# Patient Record
Sex: Female | Born: 1955 | Race: Black or African American | Hispanic: No | Marital: Single | Smoking: Never smoker
Health system: Southern US, Community
[De-identification: ages and names within clinical notes are randomized; demographics above are authoritative.]

## PROBLEM LIST (undated history)

## (undated) DIAGNOSIS — I10 Essential (primary) hypertension: Secondary | ICD-10-CM

## (undated) DIAGNOSIS — E039 Hypothyroidism, unspecified: Secondary | ICD-10-CM

## (undated) DIAGNOSIS — D649 Anemia, unspecified: Secondary | ICD-10-CM

## (undated) DIAGNOSIS — M1990 Unspecified osteoarthritis, unspecified site: Secondary | ICD-10-CM

## (undated) DIAGNOSIS — E7800 Pure hypercholesterolemia, unspecified: Secondary | ICD-10-CM

## (undated) DIAGNOSIS — C50919 Malignant neoplasm of unspecified site of unspecified female breast: Secondary | ICD-10-CM

## (undated) HISTORY — PX: BREAST EXCISIONAL BIOPSY: SUR124

## (undated) HISTORY — PX: BREAST LUMPECTOMY: SHX2

## (undated) HISTORY — DX: Malignant neoplasm of unspecified site of unspecified female breast: C50.919

## (undated) HISTORY — PX: ABDOMINAL HYSTERECTOMY: SHX81

---

## 2008-03-03 ENCOUNTER — Ambulatory Visit: Payer: Self-pay | Admitting: Family Medicine

## 2008-03-22 ENCOUNTER — Ambulatory Visit: Payer: Self-pay | Admitting: *Deleted

## 2008-03-22 ENCOUNTER — Emergency Department (HOSPITAL_COMMUNITY): Admission: EM | Admit: 2008-03-22 | Discharge: 2008-03-22 | Payer: Self-pay | Admitting: Family Medicine

## 2008-03-31 ENCOUNTER — Ambulatory Visit (HOSPITAL_COMMUNITY): Admission: RE | Admit: 2008-03-31 | Discharge: 2008-03-31 | Payer: Self-pay | Admitting: Family Medicine

## 2008-04-18 ENCOUNTER — Encounter (INDEPENDENT_AMBULATORY_CARE_PROVIDER_SITE_OTHER): Payer: Self-pay | Admitting: Family Medicine

## 2008-04-18 ENCOUNTER — Ambulatory Visit: Payer: Self-pay | Admitting: Internal Medicine

## 2008-04-18 LAB — CONVERTED CEMR LAB
Alkaline Phosphatase: 77 units/L (ref 39–117)
Basophils Absolute: 0 10*3/uL (ref 0.0–0.1)
Basophils Relative: 0 % (ref 0–1)
Calcium: 9.2 mg/dL (ref 8.4–10.5)
Chloride: 105 meq/L (ref 96–112)
Cholesterol: 248 mg/dL — ABNORMAL HIGH (ref 0–200)
Eosinophils Absolute: 0.1 10*3/uL (ref 0.0–0.7)
Eosinophils Relative: 1 % (ref 0–5)
Free T4: 0.95 ng/dL (ref 0.89–1.80)
Glucose, Bld: 89 mg/dL (ref 70–99)
HCT: 43.6 % (ref 36.0–46.0)
HDL: 51 mg/dL (ref >39)
Lymphs Abs: 2 10*3/uL (ref 0.7–4.0)
MCHC: 31.2 g/dL (ref 30.0–36.0)
MCV: 98 fL (ref 78.0–100.0)
Neutrophils Relative %: 57 % (ref 43–77)
Platelets: 272 10*3/uL (ref 150–400)
RPR Ser Ql: REACTIVE — AB
Sodium: 140 meq/L (ref 135–145)
Total Bilirubin: 0.8 mg/dL (ref 0.3–1.2)
Total Protein: 8 g/dL (ref 6.0–8.3)
VLDL: 21 mg/dL (ref 0–40)
WBC: 6.2 10*3/uL (ref 4.0–10.5)

## 2008-05-12 ENCOUNTER — Ambulatory Visit: Payer: Self-pay | Admitting: Family Medicine

## 2008-05-19 ENCOUNTER — Ambulatory Visit: Payer: Self-pay | Admitting: Internal Medicine

## 2008-05-19 ENCOUNTER — Encounter (INDEPENDENT_AMBULATORY_CARE_PROVIDER_SITE_OTHER): Payer: Self-pay | Admitting: Family Medicine

## 2008-11-01 ENCOUNTER — Ambulatory Visit: Payer: Self-pay | Admitting: Family Medicine

## 2008-11-08 ENCOUNTER — Ambulatory Visit: Payer: Self-pay | Admitting: Family Medicine

## 2009-02-14 ENCOUNTER — Ambulatory Visit: Payer: Self-pay | Admitting: Family Medicine

## 2009-02-14 LAB — CONVERTED CEMR LAB
Cholesterol: 240 mg/dL — ABNORMAL HIGH (ref 0–200)
LDL Cholesterol: 171 mg/dL — ABNORMAL HIGH (ref 0–99)
TSH: 1.503 microintl units/mL (ref 0.350–4.500)
Total CHOL/HDL Ratio: 4.7
Triglycerides: 92 mg/dL (ref <150)
VLDL: 18 mg/dL (ref 0–40)
Vit D, 25-Hydroxy: 31 ng/mL (ref 30–89)

## 2009-05-31 ENCOUNTER — Ambulatory Visit: Payer: Self-pay | Admitting: Internal Medicine

## 2009-05-31 ENCOUNTER — Encounter (INDEPENDENT_AMBULATORY_CARE_PROVIDER_SITE_OTHER): Payer: Self-pay | Admitting: Family Medicine

## 2009-05-31 LAB — CONVERTED CEMR LAB: Cholesterol: 198 mg/dL (ref 0–200)

## 2009-06-19 ENCOUNTER — Ambulatory Visit: Payer: Self-pay | Admitting: Family Medicine

## 2009-06-19 LAB — CONVERTED CEMR LAB
AST: 19 units/L (ref 0–37)
Albumin: 4.4 g/dL (ref 3.5–5.2)
Alkaline Phosphatase: 73 units/L (ref 39–117)
Total Bilirubin: 0.6 mg/dL (ref 0.3–1.2)
Total Protein: 7.5 g/dL (ref 6.0–8.3)

## 2009-06-23 ENCOUNTER — Ambulatory Visit: Payer: Self-pay | Admitting: Family Medicine

## 2009-07-04 ENCOUNTER — Encounter: Admission: RE | Admit: 2009-07-04 | Discharge: 2009-07-04 | Payer: Self-pay | Admitting: Infectious Diseases

## 2009-07-17 ENCOUNTER — Encounter: Admission: RE | Admit: 2009-07-17 | Discharge: 2009-07-17 | Payer: Self-pay | Admitting: Infectious Diseases

## 2009-11-20 ENCOUNTER — Ambulatory Visit: Payer: Self-pay | Admitting: Family Medicine

## 2009-11-20 LAB — CONVERTED CEMR LAB
Basophils Absolute: 0 10*3/uL (ref 0.0–0.1)
Basophils Relative: 0 % (ref 0–1)
HCT: 41.3 % (ref 36.0–46.0)
Lymphocytes Relative: 26 % (ref 12–46)
Lymphs Abs: 2 10*3/uL (ref 0.7–4.0)
Monocytes Absolute: 0.6 10*3/uL (ref 0.1–1.0)
Monocytes Relative: 8 % (ref 3–12)
Neutro Abs: 5 10*3/uL (ref 1.7–7.7)
Neutrophils Relative %: 65 % (ref 43–77)
Platelets: 270 10*3/uL (ref 150–400)
Vitamin B-12: 453 pg/mL (ref 211–911)
WBC: 7.6 10*3/uL (ref 4.0–10.5)

## 2009-11-24 ENCOUNTER — Ambulatory Visit: Payer: Self-pay | Admitting: Family Medicine

## 2009-12-24 IMAGING — CR DG CHEST 2V
2 series · 2 of 2 positions shown · non-contrast
Comparison: None

CLINICAL DATA: Chest pain and dizziness.  Left arm pain.

CHEST - 2 VIEW

[w chest pa]
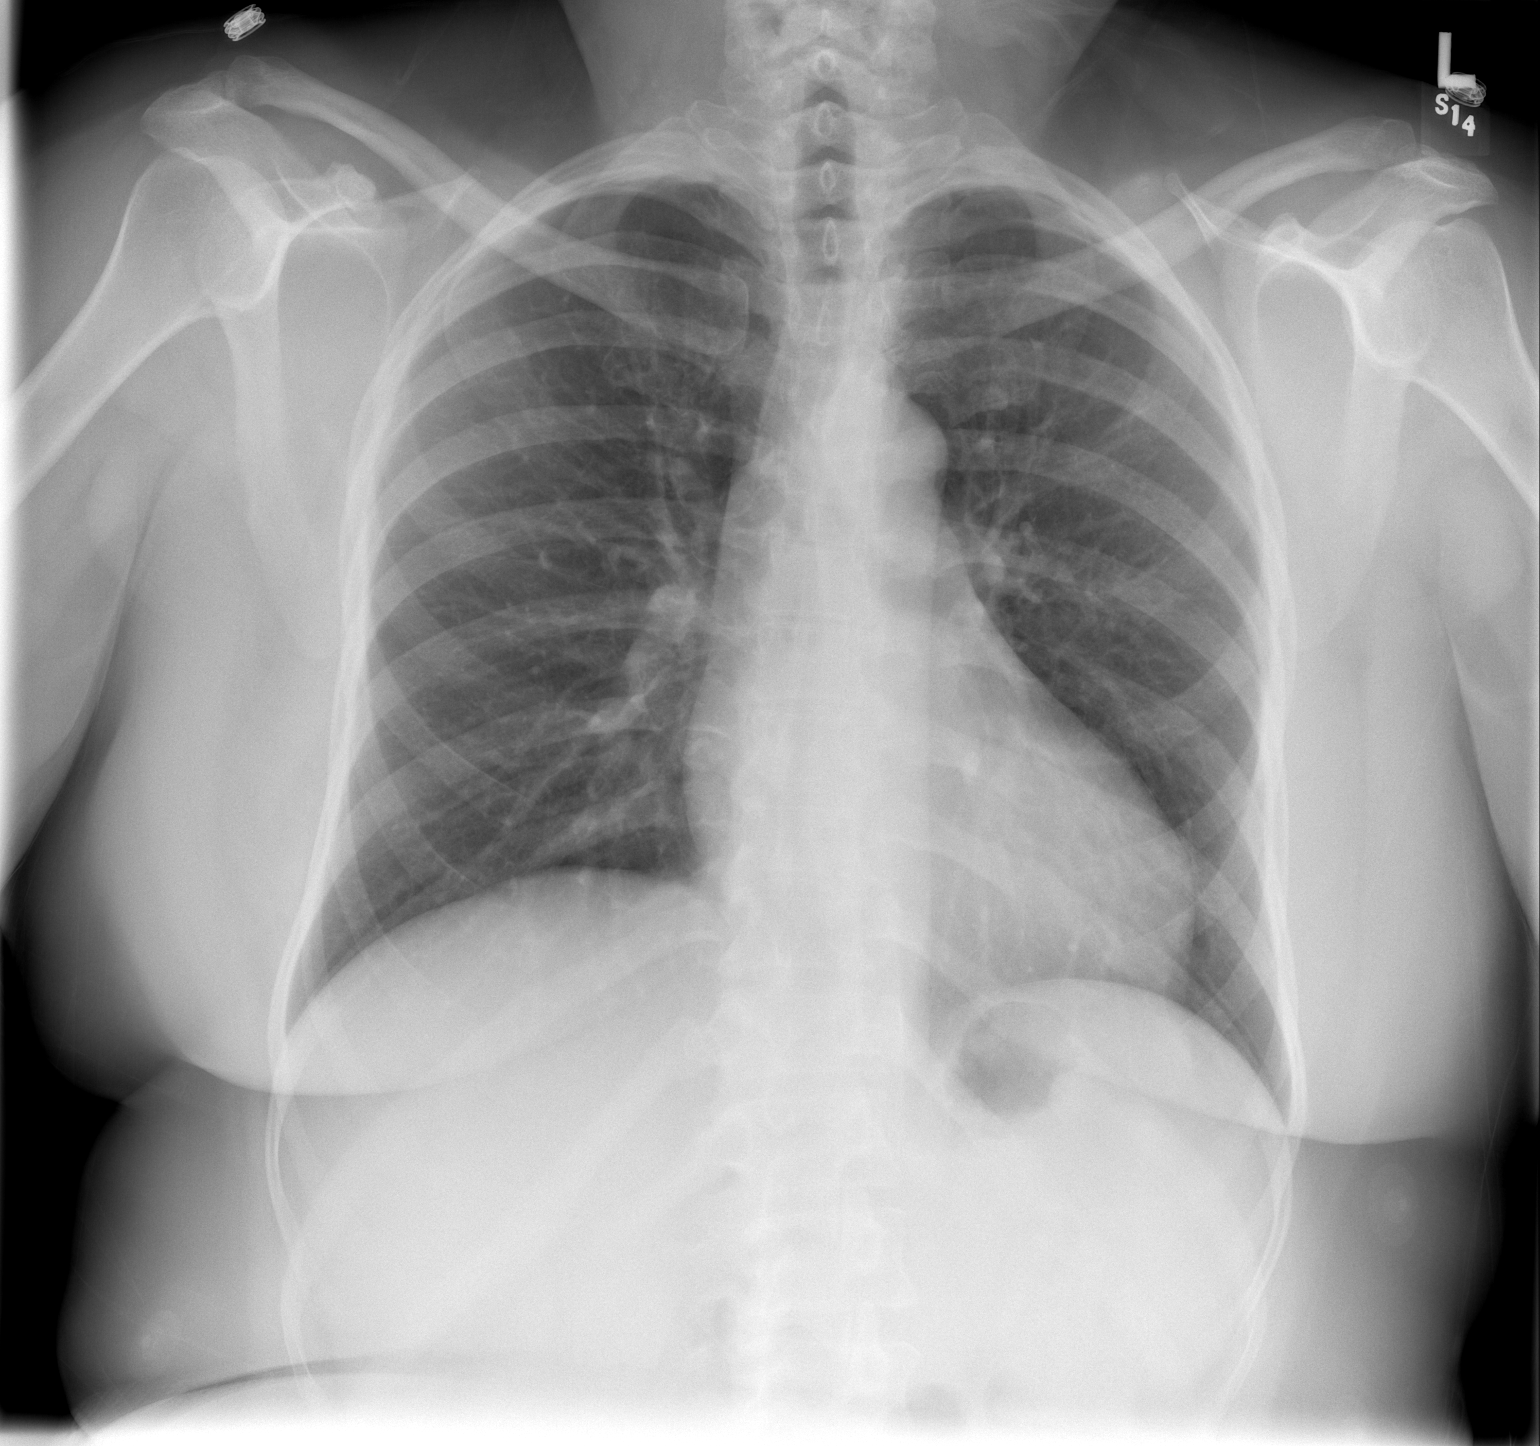

[w chest lat]
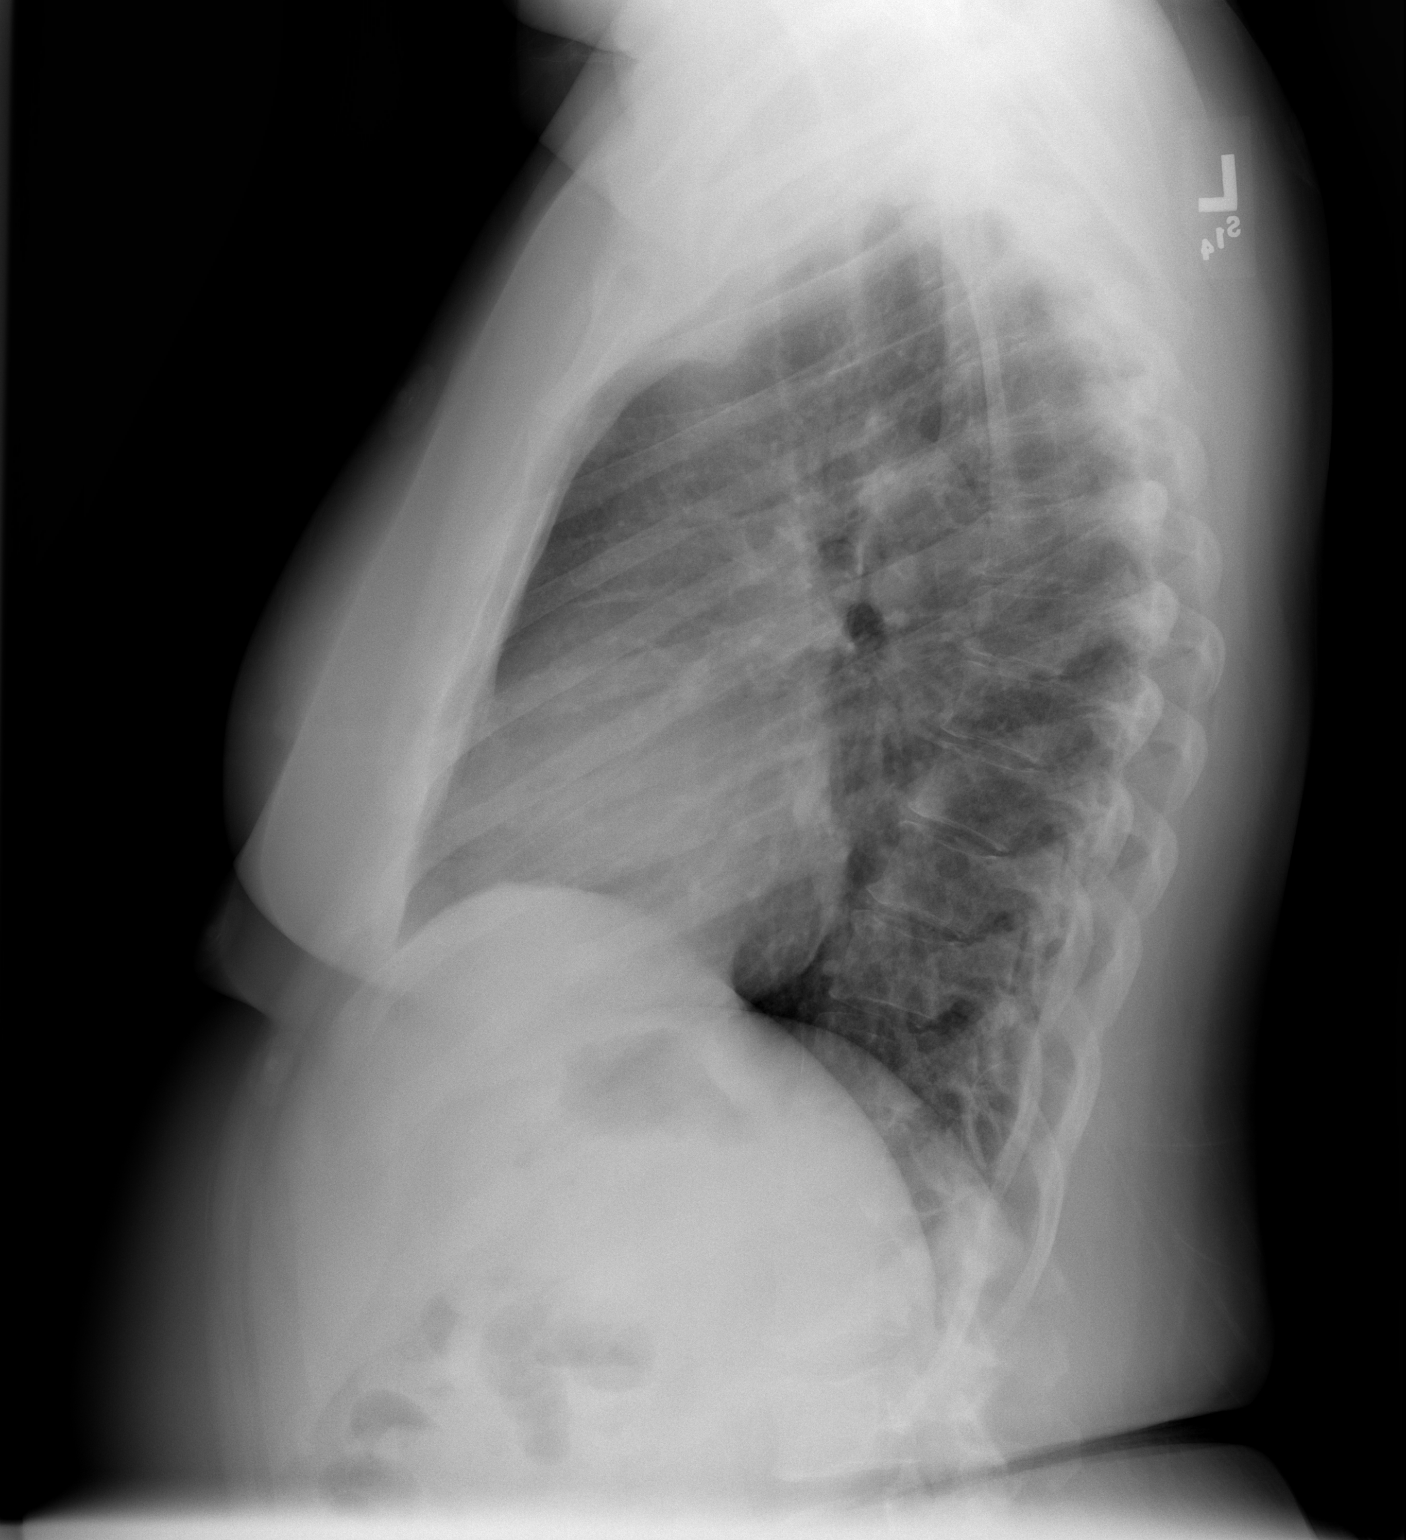

[2 of 2 positions shown; findings below may reference images not displayed]

FINDINGS: Heart size is upper normal and there is left ventricular
enlargement.  There is no heart failure or edema.  The lungs are
clear.
IMPRESSION: No active cardiopulmonary disease.

## 2010-02-23 ENCOUNTER — Ambulatory Visit: Payer: Self-pay | Admitting: Internal Medicine

## 2011-02-12 LAB — POCT CARDIAC MARKERS
CKMB, poc: 3.3
Troponin i, poc: <0.05

## 2011-02-12 LAB — CBC
Platelets: 266
RDW: 14.9
WBC: 5.8

## 2011-02-12 LAB — DIFFERENTIAL
Basophils Absolute: 0
Basophils Relative: 1
Eosinophils Relative: 1
Lymphs Abs: 1.6
Monocytes Relative: 8
Neutro Abs: 3.6
Neutrophils Relative %: 62

## 2011-02-12 LAB — POCT I-STAT, CHEM 8
BUN: 6
Calcium, Ion: 1.18
Chloride: 105
Creatinine, Ser: 0.9
TCO2: 28

## 2015-11-26 ENCOUNTER — Encounter (HOSPITAL_COMMUNITY): Payer: Self-pay

## 2015-11-26 ENCOUNTER — Emergency Department (HOSPITAL_COMMUNITY)
Admission: EM | Admit: 2015-11-26 | Discharge: 2015-11-26 | Disposition: A | Discharge status: Home / Self Care | Payer: PRIVATE HEALTH INSURANCE | Attending: Physician Assistant | Admitting: Physician Assistant

## 2015-11-26 DIAGNOSIS — Z79899 Other long term (current) drug therapy: Secondary | ICD-10-CM | POA: Insufficient documentation

## 2015-11-26 DIAGNOSIS — E039 Hypothyroidism, unspecified: Secondary | ICD-10-CM | POA: Diagnosis not present

## 2015-11-26 DIAGNOSIS — R251 Tremor, unspecified: Secondary | ICD-10-CM | POA: Insufficient documentation

## 2015-11-26 HISTORY — DX: Hypothyroidism, unspecified: E03.9

## 2015-11-26 LAB — CBC WITH DIFFERENTIAL/PLATELET
BASOS ABS: 0 10*3/uL (ref 0.0–0.1)
Basophils Relative: 1 %
Eosinophils Absolute: 0.1 10*3/uL (ref 0.0–0.7)
Eosinophils Relative: 2 %
HEMATOCRIT: 39.7 % (ref 36.0–46.0)
Hemoglobin: 13.2 g/dL (ref 12.0–15.0)
LYMPHS ABS: 2 10*3/uL (ref 0.7–4.0)
LYMPHS PCT: 30 %
MCH: 29.9 pg (ref 26.0–34.0)
MCHC: 33.2 g/dL (ref 30.0–36.0)
MCV: 90 fL (ref 78.0–100.0)
MONO ABS: 0.6 10*3/uL (ref 0.1–1.0)
Monocytes Relative: 10 %
NEUTROS ABS: 3.8 10*3/uL (ref 1.7–7.7)
Neutrophils Relative %: 57 %
Platelets: 235 10*3/uL (ref 150–400)
RBC: 4.41 MIL/uL (ref 3.87–5.11)
RDW: 14.1 % (ref 11.5–15.5)
WBC: 6.5 10*3/uL (ref 4.0–10.5)

## 2015-11-26 LAB — COMPREHENSIVE METABOLIC PANEL
ALT: 15 U/L (ref 14–54)
AST: 19 U/L (ref 15–41)
Albumin: 4.7 g/dL (ref 3.5–5.0)
Alkaline Phosphatase: 73 U/L (ref 38–126)
Anion gap: 4 — ABNORMAL LOW (ref 5–15)
BUN: 11 mg/dL (ref 6–20)
CHLORIDE: 107 mmol/L (ref 101–111)
CO2: 30 mmol/L (ref 22–32)
CREATININE: 0.72 mg/dL (ref 0.44–1.00)
Calcium: 9.4 mg/dL (ref 8.9–10.3)
GFR calc Af Amer: >60 mL/min (ref >60)
GFR calc non Af Amer: >60 mL/min (ref >60)
Glucose, Bld: 99 mg/dL (ref 65–99)
Potassium: 4 mmol/L (ref 3.5–5.1)
SODIUM: 141 mmol/L (ref 135–145)
Total Bilirubin: 0.8 mg/dL (ref 0.3–1.2)
Total Protein: 8 g/dL (ref 6.5–8.1)

## 2015-11-26 LAB — I-STAT TROPONIN, ED: Troponin i, poc: 0 ng/mL (ref 0.00–0.08)

## 2015-11-26 LAB — TSH: TSH: 3.09 u[IU]/mL (ref 0.350–4.500)

## 2015-11-26 NOTE — ED Notes (Signed)
Pt denies any symptoms at present. Last episode of sweating/lightheadedness/shaking was at 1500 and lasted 10 minutes.

## 2015-11-26 NOTE — ED Notes (Signed)
Pt informed main lab has been called to draw.  Pt assisted to BR.

## 2015-11-26 NOTE — ED Notes (Signed)
Pt c/o intermittent dizziness, excessive sweating, and bilateral hand tremors x 2 days.  Denies pain.  Denies n/v/d.   Pt reports 2 episodes x 2 days ago and 4 episodes today.  Denies currently symptoms.  Pt reports that she has been out of thyroid medication since March 2016.

## 2015-11-26 NOTE — ED Provider Notes (Signed)
CSN: [id number]     Arrival date & time 11/26/15  1703 History   First MD Initiated Contact with Patient 11/26/15 1735     Chief Complaint  Patient presents with  . Dizziness  . Excessive Sweating  . Tremors     (Consider location/radiation/quality/duration/timing/severity/associated sxs/prior Treatment) HPI   Pt is a 60 year old femlae PMH of Hashimotos, been off synthroid for a year.  Here with dizziness diaphoresis, palpitations and tremors. 4 episodes Friday, 3 episodes today. Normal appetitie and otherwise feels baseline.   Patient lost touch with PCP and hasn't been on her medications.  These events happen on exertion occasionally. Also have happened at rest.  No chest pan or SOB associated. No chest pain equivalents.   No fevers night sweats.   Past Medical History  Diagnosis Date  . Hypothyroidism    Past Surgical History  Procedure Laterality Date  . Abdominal hysterectomy    . Breast lumpectomy Left    History reviewed. No pertinent family history. Social History  Substance Use Topics  . Smoking status: Never Smoker   . Smokeless tobacco: None  . Alcohol Use: No   OB History    No data available     Review of Systems  Constitutional: Negative for fever, activity change and fatigue.  HENT: Negative for congestion.   Respiratory: Negative for shortness of breath.   Cardiovascular: Positive for palpitations. Negative for chest pain.  Gastrointestinal: Negative for abdominal pain.  Endocrine: Negative for cold intolerance, heat intolerance, polydipsia, polyphagia and polyuria.  Genitourinary: Negative for difficulty urinating.  Psychiatric/Behavioral: Positive for agitation. Negative for confusion.  All other systems reviewed and are negative.     Allergies  Penicillins  Home Medications   Prior to Admission medications   Medication Sig Start Date End Date Taking? Authorizing Provider  acetaminophen (TYLENOL) 500 MG tablet Take 1,500 mg by mouth  every 6 (six) hours as needed for mild pain or headache.   Yes Historical Provider, MD   BP 145/99 mmHg  Pulse 76  Temp(Src) 97.9 F (36.6 C) (Oral)  Resp 14  Ht  (1.626 m)  Wt 207 lb (93.895 kg)  BMI 35.51 kg/m2  SpO2 100% Physical Exam  Constitutional: She is oriented to person, place, and time. She appears well-developed and well-nourished.  HENT:  Head: Normocephalic and atraumatic.  Eyes: Conjunctivae are normal. Right eye exhibits no discharge.  Neck: Neck supple.  Cardiovascular: Normal rate, regular rhythm and normal heart sounds.   No murmur heard. Pulmonary/Chest: Effort normal and breath sounds normal. She has no wheezes. She has no rales.  Abdominal: Soft. She exhibits no distension. There is no tenderness.  Musculoskeletal: Normal range of motion. She exhibits no edema.  Neurological: She is oriented to person, place, and time. No cranial nerve deficit.  Skin: Skin is warm and dry. No rash noted. She is not diaphoretic.  Psychiatric: She has a normal mood and affect. Her behavior is normal.  Nursing note and vitals reviewed.   ED Course  Procedures (including critical care time) Labs Review Labs Reviewed  COMPREHENSIVE METABOLIC PANEL - Abnormal; Notable for the following:    Anion gap 4 (*)    All other components within normal limits  CBC WITH DIFFERENTIAL/PLATELET  TSH  T4, FREE  I-STAT TROPOININ, ED    Imaging Review No results found. I have personally reviewed and evaluated these images and lab results as part of my medical decision-making.   EKG Interpretation   Date/Time:  Sunday November 26 2015 18:30:04 EDT Ventricular Rate:  60 PR Interval:    QRS Duration: 99 QT Interval:  417 QTC Calculation: 417 R Axis:   81 Text Interpretation:  Sinus rhythm Baseline wander in lead(s) V6 no acute  ischemia noted Confirmed by Kandis MannanMACKUEN, COURTNEY ([id number]) on 11/26/2015 9:11:10  PM      MDM   Final diagnoses:  Episode of shaking    Patient is a  60 year old female presenting with multiple episodes of shakiness. Patient reports that she's had this several times where she gets up and walks around and feels a little bit shaky. Patient has untreated thyroid disease. She had Hashimoto's and was on Synthroid but has not filled her prescription in the last year.  Patient appears very well on exam. Normal physical exam, vital signs. Her labs as well as her TSH are normal. We'll discharge and have her follow up with primary care physician.   Do not think there is any acute pathology such as cardiac or pulmonary at this time given her normal vital signs, physical exam, labs.    Chi Garlow Randall AnLyn Aleina Burgio, MD 11/26/15 2118

## 2015-11-26 NOTE — Discharge Instructions (Signed)
You have a history of Hashimoto's. We recommend that you follow-up with a primary care physician to get the appropriate treatment. We do not believe there is any emergency treatment needed at this time. Please call the number on this discharge information to find a primary care physician.

## 2015-11-26 NOTE — ED Notes (Signed)
Lab called, advised that needs Lt. Green tube....informed RN.

## 2015-11-27 LAB — T4, FREE: FREE T4: 0.83 ng/dL (ref 0.61–1.12)

## 2016-05-16 ENCOUNTER — Encounter (HOSPITAL_COMMUNITY): Payer: Self-pay

## 2016-05-16 ENCOUNTER — Emergency Department (HOSPITAL_COMMUNITY)
Admission: EM | Admit: 2016-05-16 | Discharge: 2016-05-17 | Disposition: A | Discharge status: Home / Self Care | Payer: Self-pay | Attending: Emergency Medicine | Admitting: Emergency Medicine

## 2016-05-16 ENCOUNTER — Emergency Department (HOSPITAL_COMMUNITY): Payer: Self-pay

## 2016-05-16 DIAGNOSIS — K529 Noninfective gastroenteritis and colitis, unspecified: Secondary | ICD-10-CM | POA: Insufficient documentation

## 2016-05-16 DIAGNOSIS — E039 Hypothyroidism, unspecified: Secondary | ICD-10-CM | POA: Insufficient documentation

## 2016-05-16 DIAGNOSIS — R1030 Lower abdominal pain, unspecified: Secondary | ICD-10-CM

## 2016-05-16 LAB — COMPREHENSIVE METABOLIC PANEL
ALK PHOS: 66 U/L (ref 38–126)
ALT: 26 U/L (ref 14–54)
AST: 28 U/L (ref 15–41)
Albumin: 4.3 g/dL (ref 3.5–5.0)
Anion gap: 8 (ref 5–15)
BILIRUBIN TOTAL: 0.8 mg/dL (ref 0.3–1.2)
BUN: 11 mg/dL (ref 6–20)
CALCIUM: 8.7 mg/dL — AB (ref 8.9–10.3)
CO2: 25 mmol/L (ref 22–32)
Chloride: 102 mmol/L (ref 101–111)
Creatinine, Ser: 0.84 mg/dL (ref 0.44–1.00)
GFR calc Af Amer: >60 mL/min (ref >60)
GFR calc non Af Amer: >60 mL/min (ref >60)
GLUCOSE: 111 mg/dL — AB (ref 65–99)
Potassium: 3.4 mmol/L — ABNORMAL LOW (ref 3.5–5.1)
Sodium: 135 mmol/L (ref 135–145)
TOTAL PROTEIN: 7.7 g/dL (ref 6.5–8.1)

## 2016-05-16 LAB — CBC
HEMATOCRIT: 42.3 % (ref 36.0–46.0)
Hemoglobin: 14 g/dL (ref 12.0–15.0)
MCH: 29.7 pg (ref 26.0–34.0)
MCHC: 33.1 g/dL (ref 30.0–36.0)
MCV: 89.8 fL (ref 78.0–100.0)
Platelets: 208 10*3/uL (ref 150–400)
RBC: 4.71 MIL/uL (ref 3.87–5.11)
RDW: 14.4 % (ref 11.5–15.5)
WBC: 4.4 10*3/uL (ref 4.0–10.5)

## 2016-05-16 LAB — URINALYSIS, ROUTINE W REFLEX MICROSCOPIC
BACTERIA UA: NONE SEEN
Bilirubin Urine: NEGATIVE
GLUCOSE, UA: NEGATIVE mg/dL
KETONES UR: NEGATIVE mg/dL
Leukocytes, UA: NEGATIVE
NITRITE: NEGATIVE
PROTEIN: 30 mg/dL — AB
Specific Gravity, Urine: 1.029 (ref 1.005–1.030)
pH: 5 (ref 5.0–8.0)

## 2016-05-16 LAB — LIPASE, BLOOD: Lipase: 28 U/L (ref 11–51)

## 2016-05-16 MED ORDER — SODIUM CHLORIDE 0.9 % IV BOLUS (SEPSIS)
1000.0000 mL | Freq: Once | INTRAVENOUS | Status: AC
  Administered 2016-05-16: 1000 mL via INTRAVENOUS

## 2016-05-16 MED ORDER — ONDANSETRON HCL 4 MG/2ML IJ SOLN
4.0000 mg | Freq: Once | INTRAMUSCULAR | Status: AC
  Administered 2016-05-16: 4 mg via INTRAVENOUS
  Filled 2016-05-16: qty 2

## 2016-05-16 MED ORDER — POTASSIUM CHLORIDE CRYS ER 20 MEQ PO TBCR
40.0000 meq | EXTENDED_RELEASE_TABLET | Freq: Once | ORAL | Status: AC
  Administered 2016-05-16: 40 meq via ORAL
  Filled 2016-05-16: qty 2

## 2016-05-16 MED ORDER — IOPAMIDOL (ISOVUE-300) INJECTION 61%
INTRAVENOUS | Status: AC
  Filled 2016-05-16: qty 100

## 2016-05-16 NOTE — ED Triage Notes (Signed)
PT C/O UMBILICAL ABDOMINAL PAIN WITH DIARRHEA X2 DAYS. PT STS SHE VOMITED X1 TODAY. PT ALSO C/O JOINT PAIN AND SKIN SENSITIVITY. DENIES FEVER.

## 2016-05-16 NOTE — ED Provider Notes (Signed)
[facility]-EMERGENCY DEPT Provider Note   CSN: [id number] Arrival date & time: 05/16/16  1857   By signing my name below, I, Nelwyn SalisburyJoshua Fowler, attest that this documentation has been prepared under the direction and in the presence of Pricilla LovelessScott Gurkirat Basher, MD . Electronically Signed: Nelwyn SalisburyJoshua Fowler, Scribe. 05/16/2016. 11:31 PM.  History   Chief Complaint Chief Complaint  Patient presents with  . Abdominal Pain  . Diarrhea    The history is provided by the patient. No language interpreter was used.    HPI Comments:  Taylor House is a 61 y.o. female with pmhx of hypothyroidism who presents to the Emergency Department complaining of sudden-onset gradually resolving constant periumbilical abdominal pain onset yesterday. She describes her pain as a 6/10 sharp pain that does not radiate. Pt reports associated nausea, vomiting and diarrhea. She notes that her vomiting and diarrhea have currently resolved, and her last episode of diarrhea was about 7 hours ago. No modifying factors indicated. She denies any back pain, dysuria, hematuria or blood in her stool. 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    Past Medical History:  Diagnosis Date  . Hypothyroidism     There are no active problems to display for this patient.   Past Surgical History:  Procedure Laterality Date  . ABDOMINAL HYSTERECTOMY    . BREAST LUMPECTOMY Left     OB History    No data available       Home Medications    Prior to Admission medications   Medication Sig Start Date End Date Taking? Authorizing Provider  acetaminophen (TYLENOL) 500 MG tablet Take 1,500 mg by mouth every 6 (six) hours as needed for mild pain or headache.   Yes Historical Provider, MD  ondansetron (ZOFRAN ODT) 4 MG disintegrating tablet Take 1 tablet (4 mg total) by mouth every 8 (eight) hours as needed for nausea or vomiting. 05/17/16   Pricilla LovelessScott Pedro Oldenburg, MD    Family History History reviewed. No  pertinent family history.  Social History Social History  Substance Use Topics  . Smoking status: Never Smoker  . Smokeless tobacco: Never Used  . Alcohol use No     Allergies   Penicillins   Review of Systems Review of Systems  Gastrointestinal: Positive for abdominal pain, diarrhea, nausea and vomiting. Negative for blood in stool.  Genitourinary: Negative for dysuria and hematuria.  Musculoskeletal: Negative for back pain.  All other systems reviewed and are negative.    Physical Exam Updated Vital Signs BP 132/76 (BP Location: Left Arm)   Pulse 93   Temp 99.8 F (37.7 C) (Oral)   Resp 18   Ht 5' 4.5" (1.638 m)   Wt 201 lb (91.2 kg)   SpO2 99%   BMI 33.97 kg/m   Physical Exam  Constitutional: She is oriented to person, place, and time. She appears well-developed and well-nourished.  HENT:  Head: Normocephalic and atraumatic.  Right Ear: External ear normal.  Left Ear: External ear normal.  Nose: Nose normal.  Eyes: Right eye exhibits no discharge. Left eye exhibits no discharge.  Cardiovascular: Normal rate, regular rhythm and normal heart sounds.   Pulmonary/Chest: Effort normal and breath sounds normal.  Abdominal: Soft. There is tenderness (Moderate suprapubic and LLQ tenderness).  Neurological: She is alert and oriented to person, place, and time.  Skin: Skin is warm and dry.  Nursing note and vitals reviewed.    ED Treatments / Results  DIAGNOSTIC  STUDIES:  Oxygen Saturation is 100% on RA, normal by my interpretation.    COORDINATION OF CARE:  11:36 PM Discussed treatment plan with pt at bedside which includes Zofran and imaging and pt agreed to plan. Pt denies any pain medication in the ED   Labs (all labs ordered are listed, but only abnormal results are displayed) Labs Reviewed  COMPREHENSIVE METABOLIC PANEL - Abnormal; Notable for the following:       Result Value   Potassium 3.4 (*)    Glucose, Bld 111 (*)    Calcium 8.7 (*)    All  other components within normal limits  URINALYSIS, ROUTINE W REFLEX MICROSCOPIC - Abnormal; Notable for the following:    Color, Urine AMBER (*)    APPearance HAZY (*)    Hgb urine dipstick MODERATE (*)    Protein, ur 30 (*)    Squamous Epithelial / LPF 0-5 (*)    All other components within normal limits  LIPASE, BLOOD  CBC    EKG  EKG Interpretation None       Radiology Ct Abdomen Pelvis W Contrast  Result Date: 05/17/2016 CLINICAL DATA:  Periumbilical pain and diarrhea for 2 days. EXAM: CT ABDOMEN AND PELVIS WITH CONTRAST TECHNIQUE: Multidetector CT imaging of the abdomen and pelvis was performed using the standard protocol following bolus administration of intravenous contrast. CONTRAST:  ISOVUE-300 IOPAMIDOL (ISOVUE-300) INJECTION 61% COMPARISON:  None. FINDINGS: Lower chest: No acute abnormality. Hepatobiliary: Normal gallbladder and bile ducts. 7 mm hypodensity in the right hepatic lobe medially, more likely benign although not conclusively characterized. No suspicious focal liver lesions. Pancreas: Unremarkable. No pancreatic ductal dilatation or surrounding inflammatory changes. Spleen: Normal in size without focal abnormality. Adrenals/Urinary Tract: Multiple low-attenuation renal lesions, likely simple cysts, measuring up to 1.9 cm in the upper left renal pole. No suspicious renal parenchymal lesions. No urinary calculi. No hydronephrosis or ureteral dilatation. Unremarkable urinary bladder. Both adrenals are normal. Stomach/Bowel: Stomach is within normal limits. Appendix appears normal. No evidence of bowel wall thickening, distention, or inflammatory changes. Vascular/Lymphatic: No significant vascular findings are present. No enlarged abdominal or pelvic lymph nodes. Reproductive: Status post hysterectomy. No adnexal masses. Other: No acute inflammation.  No ascites. Musculoskeletal: No significant skeletal lesions. IMPRESSION: No significant abnormality. Electronically  Signed   By: Ellery Plunk M.D.   On: 05/17/2016 00:21    Procedures Procedures (including critical care time)  Medications Ordered in ED Medications  potassium chloride SA (K-DUR,KLOR-CON) CR tablet 40 mEq (40 mEq Oral Given 05/16/16 2355)  sodium chloride 0.9 % bolus 1,000 mL (0 mLs Intravenous Stopped 05/17/16 0100)  ondansetron (ZOFRAN) injection 4 mg (4 mg Intravenous Given 05/16/16 2356)  iopamidol (ISOVUE-300) 61 % injection 100 mL (100 mLs Intravenous Contrast Given 05/17/16 0003)     Initial Impression / Assessment and Plan / ED Course  I have reviewed the triage vital signs and the nursing notes.  Pertinent labs & imaging results that were available during my care of the patient were reviewed by me and considered in my medical decision making (see chart for details).  Clinical Course as of May 17 1720  Morey Hummingbird May 16, 2016  2337 CT due to focal lower abdominal pain, r/o diverticulitis  [SG]  Fri May 17, 2016  0058 CT negative.  [SG]    Clinical Course User Index [SG] Pricilla Loveless, MD    Declines pain meds in ED. No vomiting, tolerating PO. Likely a viral GI illness. Labwork unremarkable besides mild  hypokalemia, repleted in ED. Zofran as needed, increase fluids at home, f/u with PCP. Discussed return precautions.   Final Clinical Impressions(s) / ED Diagnoses   Final diagnoses:  Acute gastroenteritis  Lower abdominal pain    New Prescriptions Discharge Medication List as of 05/17/2016 12:58 AM    START taking these medications   Details  ondansetron (ZOFRAN ODT) 4 MG disintegrating tablet Take 1 tablet (4 mg total) by mouth every 8 (eight) hours as needed for nausea or vomiting., Starting Fri 05/17/2016, Print       I personally performed the services described in this documentation, which was scribed in my presence. The recorded information has been reviewed and is accurate.     Pricilla Loveless, MD 05/17/16 1723

## 2016-05-17 MED ORDER — IOPAMIDOL (ISOVUE-300) INJECTION 61%
100.0000 mL | Freq: Once | INTRAVENOUS | Status: AC | PRN
  Administered 2016-05-17: 100 mL via INTRAVENOUS

## 2016-05-17 MED ORDER — ONDANSETRON 4 MG PO TBDP: 4.0000 mg | ORAL_TABLET | Freq: Three times a day (TID) | ORAL | 0 refills | Status: DC | PRN

## 2018-04-27 DIAGNOSIS — Z8679 Personal history of other diseases of the circulatory system: Secondary | ICD-10-CM | POA: Diagnosis not present

## 2018-04-27 DIAGNOSIS — R8290 Unspecified abnormal findings in urine: Secondary | ICD-10-CM | POA: Diagnosis not present

## 2018-04-27 DIAGNOSIS — Z13220 Encounter for screening for lipoid disorders: Secondary | ICD-10-CM | POA: Diagnosis not present

## 2018-04-27 DIAGNOSIS — Z23 Encounter for immunization: Secondary | ICD-10-CM | POA: Diagnosis not present

## 2018-04-27 DIAGNOSIS — Z0000 Encounter for general adult medical examination without abnormal findings: Secondary | ICD-10-CM | POA: Diagnosis not present

## 2018-04-27 DIAGNOSIS — Z8639 Personal history of other endocrine, nutritional and metabolic disease: Secondary | ICD-10-CM | POA: Diagnosis not present

## 2018-04-29 ENCOUNTER — Other Ambulatory Visit: Payer: Self-pay | Admitting: Physician Assistant

## 2018-04-29 DIAGNOSIS — Z1231 Encounter for screening mammogram for malignant neoplasm of breast: Secondary | ICD-10-CM

## 2018-04-29 DIAGNOSIS — E2839 Other primary ovarian failure: Secondary | ICD-10-CM

## 2018-05-14 DIAGNOSIS — R8290 Unspecified abnormal findings in urine: Secondary | ICD-10-CM | POA: Diagnosis not present

## 2018-05-18 ENCOUNTER — Ambulatory Visit
Admission: RE | Admit: 2018-05-18 | Discharge: 2018-05-18 | Disposition: A | Discharge status: Home / Self Care | Payer: BLUE CROSS/BLUE SHIELD | Source: Ambulatory Visit | Attending: Physician Assistant | Admitting: Physician Assistant

## 2018-05-18 DIAGNOSIS — Z1231 Encounter for screening mammogram for malignant neoplasm of breast: Secondary | ICD-10-CM | POA: Diagnosis not present

## 2018-06-12 DIAGNOSIS — R351 Nocturia: Secondary | ICD-10-CM | POA: Diagnosis not present

## 2018-06-12 DIAGNOSIS — R8271 Bacteriuria: Secondary | ICD-10-CM | POA: Diagnosis not present

## 2018-06-12 DIAGNOSIS — R3121 Asymptomatic microscopic hematuria: Secondary | ICD-10-CM | POA: Diagnosis not present

## 2018-06-29 DIAGNOSIS — N281 Cyst of kidney, acquired: Secondary | ICD-10-CM | POA: Diagnosis not present

## 2018-06-29 DIAGNOSIS — R351 Nocturia: Secondary | ICD-10-CM | POA: Diagnosis not present

## 2018-06-29 DIAGNOSIS — R3121 Asymptomatic microscopic hematuria: Secondary | ICD-10-CM | POA: Diagnosis not present

## 2018-08-17 DIAGNOSIS — M545 Low back pain: Secondary | ICD-10-CM | POA: Diagnosis not present

## 2019-03-15 ENCOUNTER — Emergency Department (HOSPITAL_COMMUNITY)
Admission: EM | Admit: 2019-03-15 | Discharge: 2019-03-15 | Disposition: A | Discharge status: Home / Self Care | Payer: BLUE CROSS/BLUE SHIELD | Attending: Emergency Medicine | Admitting: Emergency Medicine

## 2019-03-15 ENCOUNTER — Emergency Department (HOSPITAL_COMMUNITY): Payer: BLUE CROSS/BLUE SHIELD

## 2019-03-15 ENCOUNTER — Other Ambulatory Visit: Payer: Self-pay

## 2019-03-15 ENCOUNTER — Encounter (HOSPITAL_COMMUNITY): Payer: Self-pay

## 2019-03-15 DIAGNOSIS — I10 Essential (primary) hypertension: Secondary | ICD-10-CM | POA: Insufficient documentation

## 2019-03-15 DIAGNOSIS — Z7982 Long term (current) use of aspirin: Secondary | ICD-10-CM | POA: Insufficient documentation

## 2019-03-15 DIAGNOSIS — E039 Hypothyroidism, unspecified: Secondary | ICD-10-CM | POA: Insufficient documentation

## 2019-03-15 DIAGNOSIS — R072 Precordial pain: Secondary | ICD-10-CM | POA: Insufficient documentation

## 2019-03-15 HISTORY — DX: Essential (primary) hypertension: I10

## 2019-03-15 LAB — CBC
HCT: 41.4 % (ref 36.0–46.0)
Hemoglobin: 13.4 g/dL (ref 12.0–15.0)
MCH: 30.3 pg (ref 26.0–34.0)
MCHC: 32.4 g/dL (ref 30.0–36.0)
MCV: 93.7 fL (ref 80.0–100.0)
Platelets: 262 10*3/uL (ref 150–400)
RBC: 4.42 MIL/uL (ref 3.87–5.11)
RDW: 15 % (ref 11.5–15.5)
WBC: 6.4 10*3/uL (ref 4.0–10.5)
nRBC: 0 % (ref 0.0–0.2)

## 2019-03-15 LAB — BASIC METABOLIC PANEL
Anion gap: 9 (ref 5–15)
BUN: 11 mg/dL (ref 8–23)
CO2: 26 mmol/L (ref 22–32)
Calcium: 9 mg/dL (ref 8.9–10.3)
Chloride: 104 mmol/L (ref 98–111)
Creatinine, Ser: 0.76 mg/dL (ref 0.44–1.00)
GFR calc Af Amer: >60 mL/min (ref >60)
GFR calc non Af Amer: >60 mL/min (ref >60)
Glucose, Bld: 97 mg/dL (ref 70–99)
Potassium: 3.6 mmol/L (ref 3.5–5.1)
Sodium: 139 mmol/L (ref 135–145)

## 2019-03-15 LAB — TROPONIN I (HIGH SENSITIVITY)
Troponin I (High Sensitivity): 3 ng/L (ref <18)
Troponin I (High Sensitivity): 3 ng/L (ref <18)

## 2019-03-15 MED ORDER — SODIUM CHLORIDE 0.9% FLUSH: 3.0000 mL | Freq: Once | INTRAVENOUS | Status: DC

## 2019-03-15 MED ORDER — ALUM & MAG HYDROXIDE-SIMETH 200-200-20 MG/5ML PO SUSP
30.0000 mL | Freq: Once | ORAL | Status: AC
  Administered 2019-03-15: 30 mL via ORAL
  Filled 2019-03-15: qty 30

## 2019-03-15 MED ORDER — KETOROLAC TROMETHAMINE 30 MG/ML IJ SOLN
15.0000 mg | Freq: Once | INTRAMUSCULAR | Status: AC
  Administered 2019-03-15: 15 mg via INTRAVENOUS
  Filled 2019-03-15: qty 1

## 2019-03-15 MED ORDER — LIDOCAINE VISCOUS HCL 2 % MT SOLN
15.0000 mL | Freq: Once | OROMUCOSAL | Status: AC
  Administered 2019-03-15: 15 mL via ORAL
  Filled 2019-03-15: qty 15

## 2019-03-15 MED ORDER — OMEPRAZOLE 20 MG PO CPDR
20.0000 mg | DELAYED_RELEASE_CAPSULE | Freq: Every day | ORAL | 0 refills | Status: DC
Start: 2019-03-15 — End: 2022-11-20

## 2019-03-15 NOTE — ED Provider Notes (Signed)
[facility]-EMERGENCY DEPT Provider Note   CSN: [id number] Arrival date & time: 03/15/19  0257     History   Chief Complaint Chief Complaint  Patient presents with  . Chest Pain    HPI Taylor MccreedySusan House is a 63 y.o. female.  HPI: A 63 year old patient with a history of obesity presents for evaluation of chest pain. Initial onset of pain was more than 6 hours ago. The patient's chest pain is not worse with exertion. The patient's chest pain is middle- or left-sided, is not well-localized, is not described as heaviness/pressure/tightness, is not sharp and does not radiate to the arms/jaw/neck. The patient does not complain of nausea and denies diaphoresis. The patient has no history of stroke, has no history of peripheral artery disease, has not smoked in the past 90 days, denies any history of treated diabetes, has no relevant family history of coronary artery disease (first degree relative at less than age 62), is not hypertensive and has no history of hypercholesterolemia.   The history is provided by the patient.  Chest Pain Pain location:  Substernal area and L chest Pain quality: dull   Pain radiates to:  Does not radiate Pain severity:  Moderate Onset quality:  Gradual Duration:  5 days Timing:  Constant Progression:  Waxing and waning Chronicity:  New Context: at rest   Context: not breathing, not lifting, not movement and not raising an arm   Relieved by:  Nothing Worsened by:  Nothing Ineffective treatments:  Aspirin Associated symptoms: no abdominal pain, no altered mental status, no back pain, no claudication, no cough, no diaphoresis, no dysphagia, no fatigue, no fever, no headache, no lower extremity edema, no near-syncope, no numbness, no orthopnea, no palpitations, no PND, no shortness of breath, no vomiting and no weakness   Patient presents with SS and l sided CP that came on late Thursday.  It is waxing and waning. Her hand feels tingling with  this.  No DOE, no exertional symptoms.  Symptoms worse lying flat.  No changes in vision or speech.  No weakness.  She denied neck or back pain.  No v/d.  No leg pain or swelling.  No travel.    Past Medical History:  Diagnosis Date  . Hypertension   . Hypothyroidism     There are no active problems to display for this patient.   Past Surgical History:  Procedure Laterality Date  . ABDOMINAL HYSTERECTOMY    . BREAST EXCISIONAL BIOPSY Left    benign  . BREAST LUMPECTOMY Left      OB History   No obstetric history on file.      Home Medications    Prior to Admission medications   Medication Sig Start Date End Date Taking? Authorizing Provider  acetaminophen (TYLENOL) 500 MG tablet Take 1,500 mg by mouth every 6 (six) hours as needed for mild pain or headache.   Yes [provider]  aspirin EC 325 MG tablet Take 325 mg by mouth once.   Yes [provider]    Family History Family History  Problem Relation Age of Onset  . Breast cancer Mother   . Breast cancer Maternal Aunt   . Breast cancer Maternal Grandmother     Social History Social History   Tobacco Use  . Smoking status: Never Smoker  . Smokeless tobacco: Never Used  Substance Use Topics  . Alcohol use: No  . Drug use: No     Allergies   Penicillins  Review of Systems Review of Systems  Constitutional: Negative for diaphoresis, fatigue and fever.  HENT: Negative for congestion and trouble swallowing.   Eyes: Negative for visual disturbance.  Respiratory: Negative for cough and shortness of breath.   Cardiovascular: Positive for chest pain. Negative for palpitations, orthopnea, claudication, leg swelling, PND and near-syncope.  Gastrointestinal: Negative for abdominal pain and vomiting.  Genitourinary: Negative for difficulty urinating.  Musculoskeletal: Negative for arthralgias and back pain.  Neurological: Negative for facial asymmetry, weakness, numbness and headaches.  All  other systems reviewed and are negative.    Physical Exam Updated Vital Signs BP (!) 153/87   Pulse 61   Temp 97.9 F (36.6 C) (Oral)   Resp 13   SpO2 99%   Physical Exam Vitals signs and nursing note reviewed.  Constitutional:      General: She is not in acute distress.    Appearance: She is not diaphoretic.  HENT:     Head: Normocephalic and atraumatic.     Nose: Nose normal.  Eyes:     Conjunctiva/sclera: Conjunctivae normal.     Pupils: Pupils are equal, round, and reactive to light.  Neck:     Musculoskeletal: Normal range of motion and neck supple.  Cardiovascular:     Rate and Rhythm: Normal rate and regular rhythm.     Pulses: Normal pulses.     Heart sounds: Normal heart sounds.  Pulmonary:     Effort: Pulmonary effort is normal.     Breath sounds: Normal breath sounds.  Abdominal:     General: Abdomen is flat. Bowel sounds are normal.     Tenderness: There is no abdominal tenderness. There is no guarding.  Musculoskeletal: Normal range of motion.     Right lower leg: No edema.     Left lower leg: No edema.  Skin:    General: Skin is warm and dry.     Capillary Refill: Capillary refill takes less than 2 seconds.  Neurological:     General: No focal deficit present.     Mental Status: She is alert and oriented to person, place, and time.     Deep Tendon Reflexes: Reflexes normal.  Psychiatric:        Thought Content: Thought content normal.      ED Treatments / Results  Labs (all labs ordered are listed, but only abnormal results are displayed) Results for orders placed or performed during the hospital encounter of 03/15/19  Basic metabolic panel  Result Value Ref Range   Sodium 139 135 - 145 mmol/L   Potassium 3.6 3.5 - 5.1 mmol/L   Chloride 104 98 - 111 mmol/L   CO2 26 22 - 32 mmol/L   Glucose, Bld 97 70 - 99 mg/dL   BUN 11 8 - 23 mg/dL   Creatinine, Ser 4.09 0.44 - 1.00 mg/dL   Calcium 9.0 8.9 - 81.1 mg/dL   GFR calc non Af Amer >60 >60  mL/min   GFR calc Af Amer >60 >60 mL/min   Anion gap 9 5 - 15  CBC  Result Value Ref Range   WBC 6.4 4.0 - 10.5 K/uL   RBC 4.42 3.87 - 5.11 MIL/uL   Hemoglobin 13.4 12.0 - 15.0 g/dL   HCT 91.4 78.2 - 95.6 %   MCV 93.7 80.0 - 100.0 fL   MCH 30.3 26.0 - 34.0 pg   MCHC 32.4 30.0 - 36.0 g/dL   RDW 21.3 08.6 - 57.8 %   Platelets 262  150 - 400 K/uL   nRBC 0.0 0.0 - 0.2 %  Troponin I (High Sensitivity)  Result Value Ref Range   Troponin I (High Sensitivity) 3 <18 ng/L   Dg Chest 2 View  Result Date: 03/15/2019 CLINICAL DATA:  Chest pain EXAM: CHEST - 2 VIEW COMPARISON:  03/22/2008 FINDINGS: Heart and mediastinal contours are within normal limits. No focal opacities or effusions. No acute bony abnormality. IMPRESSION: No active cardiopulmonary disease. Electronically Signed   By: Charlett Nose M.D.   On: 03/15/2019 03:57    EKG EKG Interpretation  Date/Time:  Monday March 15 2019 03:07:18 EST Ventricular Rate:  64 PR Interval:    QRS Duration: 97 QT Interval:  409 QTC Calculation: 422 R Axis:   71 Text Interpretation: Sinus rhythm Baseline wander in lead(s) V6 Confirmed by Nicanor Alcon, Katria Botts ([id number]) on 03/15/2019 6:15:54 AM   Radiology Dg Chest 2 View  Result Date: 03/15/2019 CLINICAL DATA:  Chest pain EXAM: CHEST - 2 VIEW COMPARISON:  03/22/2008 FINDINGS: Heart and mediastinal contours are within normal limits. No focal opacities or effusions. No acute bony abnormality. IMPRESSION: No active cardiopulmonary disease. Electronically Signed   By: Charlett Nose M.D.   On: 03/15/2019 03:57    Procedures Procedures (including critical care time)  Medications Ordered in ED Medications  sodium chloride flush (NS) 0.9 % injection 3 mL (has no administration in time range)  alum & mag hydroxide-simeth (MAALOX/MYLANTA) 200-200-20 MG/5ML suspension 30 mL (has no administration in time range)    And  lidocaine (XYLOCAINE) 2 % viscous mouth solution 15 mL (has no administration in time  range)  ketorolac (TORADOL) 30 MG/ML injection 15 mg (has no administration in time range)     Initial Impression / Assessment and Plan / ED Course  I have reviewed the triage vital signs and the nursing notes.  Pertinent labs & imaging results that were available during my care of the patient were reviewed by me and considered in my medical decision making (see chart for details).     HEAR Score: 2   Low risk for MACE.  Normal EKG and 2 normal troponins. Given ongoing nature and worse lying flat, I suspect this is GERD.  Will start PPI and have advised GERD friendly diet.  Will refer to cardiology for outpatient stress test.    Taylor House was evaluated in Emergency Department on 03/15/2019 for the symptoms described in the history of present illness. She was evaluated in the context of the global COVID-19 pandemic, which necessitated consideration that the patient might be at risk for infection with the SARS-CoV-2 virus that causes COVID-19. Institutional protocols and algorithms that pertain to the evaluation of patients at risk for COVID-19 are in a state of rapid change based on information released by regulatory bodies including the CDC and federal and state organizations. These policies and algorithms were followed during the patient's care in the ED.   Final Clinical Impressions(s) / ED Diagnoses   Return for weakness, numbness, changes in vision or speech, fevers >100.4 unrelieved by medication, shortness of breath, intractable vomiting, or diarrhea, abdominal pain, Inability to tolerate liquids or food, cough, altered mental status or any concerns. No signs of systemic illness or infection. The patient is nontoxic-appearing on exam and vital signs are within normal limits.   I have reviewed the triage vital signs and the nursing notes. Pertinent labs &imaging results that were available during my care of the patient were reviewed by me and considered in my  medical decision  making (see chart for details).  After history, exam, and medical workup I feel the patient has been appropriately medically screened and is safe for discharge home. Pertinent diagnoses were discussed with the patient. Patient was given return precautions     Wyoma Genson, MD 03/15/19 [phone]

## 2019-03-15 NOTE — ED Triage Notes (Signed)
Pt arrived stating that on Friday she noticed numbess down her left arm and left sided chest pain that radiates to her back. Reporting nausea, dizziness, and  lightheadedness.

## 2019-03-15 NOTE — ED Notes (Signed)
Patient states her pain in her chest is better, but she still feels it.

## 2019-07-19 ENCOUNTER — Other Ambulatory Visit: Payer: Self-pay | Admitting: Physician Assistant

## 2019-07-19 DIAGNOSIS — Z13820 Encounter for screening for osteoporosis: Secondary | ICD-10-CM

## 2019-08-26 ENCOUNTER — Ambulatory Visit
Admission: RE | Admit: 2019-08-26 | Discharge: 2019-08-26 | Disposition: A | Discharge status: Home / Self Care | Payer: Self-pay | Source: Ambulatory Visit | Attending: Physician Assistant | Admitting: Physician Assistant

## 2019-08-26 ENCOUNTER — Other Ambulatory Visit: Payer: Self-pay | Admitting: Physician Assistant

## 2019-08-26 DIAGNOSIS — R52 Pain, unspecified: Secondary | ICD-10-CM

## 2019-10-19 ENCOUNTER — Ambulatory Visit
Admission: RE | Admit: 2019-10-19 | Discharge: 2019-10-19 | Disposition: A | Discharge status: Home / Self Care | Payer: PRIVATE HEALTH INSURANCE | Source: Ambulatory Visit | Attending: Physician Assistant | Admitting: Physician Assistant

## 2019-10-19 ENCOUNTER — Other Ambulatory Visit: Payer: Self-pay

## 2019-10-19 DIAGNOSIS — Z13820 Encounter for screening for osteoporosis: Secondary | ICD-10-CM

## 2021-06-11 ENCOUNTER — Other Ambulatory Visit: Payer: Self-pay

## 2021-06-11 ENCOUNTER — Encounter (HOSPITAL_COMMUNITY): Payer: Self-pay

## 2021-06-11 ENCOUNTER — Emergency Department (HOSPITAL_COMMUNITY)
Admission: EM | Admit: 2021-06-11 | Discharge: 2021-06-11 | Disposition: A | Discharge status: Home / Self Care | Payer: PRIVATE HEALTH INSURANCE | Attending: Emergency Medicine | Admitting: Emergency Medicine

## 2021-06-11 ENCOUNTER — Emergency Department (HOSPITAL_COMMUNITY): Payer: PRIVATE HEALTH INSURANCE

## 2021-06-11 DIAGNOSIS — R42 Dizziness and giddiness: Secondary | ICD-10-CM | POA: Insufficient documentation

## 2021-06-11 DIAGNOSIS — Z7982 Long term (current) use of aspirin: Secondary | ICD-10-CM | POA: Insufficient documentation

## 2021-06-11 DIAGNOSIS — R0789 Other chest pain: Secondary | ICD-10-CM | POA: Insufficient documentation

## 2021-06-11 DIAGNOSIS — R079 Chest pain, unspecified: Secondary | ICD-10-CM

## 2021-06-11 DIAGNOSIS — R110 Nausea: Secondary | ICD-10-CM | POA: Insufficient documentation

## 2021-06-11 DIAGNOSIS — R002 Palpitations: Secondary | ICD-10-CM | POA: Insufficient documentation

## 2021-06-11 DIAGNOSIS — R519 Headache, unspecified: Secondary | ICD-10-CM | POA: Insufficient documentation

## 2021-06-11 HISTORY — DX: Pure hypercholesterolemia, unspecified: E78.00

## 2021-06-11 LAB — BASIC METABOLIC PANEL
Anion gap: 6 (ref 5–15)
BUN: 12 mg/dL (ref 8–23)
CO2: 26 mmol/L (ref 22–32)
Calcium: 8.7 mg/dL — ABNORMAL LOW (ref 8.9–10.3)
Chloride: 106 mmol/L (ref 98–111)
Creatinine, Ser: 0.7 mg/dL (ref 0.44–1.00)
GFR, Estimated: >60 mL/min (ref >60)
Glucose, Bld: 106 mg/dL — ABNORMAL HIGH (ref 70–99)
Potassium: 3.6 mmol/L (ref 3.5–5.1)
Sodium: 138 mmol/L (ref 135–145)

## 2021-06-11 LAB — CBC
HCT: 35.3 % — ABNORMAL LOW (ref 36.0–46.0)
Hemoglobin: 12.3 g/dL (ref 12.0–15.0)
MCH: 38.2 pg — ABNORMAL HIGH (ref 26.0–34.0)
MCHC: 34.8 g/dL (ref 30.0–36.0)
MCV: 109.6 fL — ABNORMAL HIGH (ref 80.0–100.0)
Platelets: 287 10*3/uL (ref 150–400)
RBC: 3.22 MIL/uL — ABNORMAL LOW (ref 3.87–5.11)
RDW: 13.8 % (ref 11.5–15.5)
WBC: 6.3 10*3/uL (ref 4.0–10.5)
nRBC: 0 % (ref 0.0–0.2)

## 2021-06-11 LAB — TROPONIN I (HIGH SENSITIVITY): Troponin I (High Sensitivity): 3 ng/L (ref <18)

## 2021-06-11 LAB — TSH: TSH: 3.054 u[IU]/mL (ref 0.350–4.500)

## 2021-06-11 MED ORDER — FAMOTIDINE 20 MG PO TABS: 20.0000 mg | ORAL_TABLET | Freq: Two times a day (BID) | ORAL | 0 refills | Status: DC

## 2021-06-11 MED ORDER — ONDANSETRON HCL 4 MG PO TABS: 4.0000 mg | ORAL_TABLET | Freq: Four times a day (QID) | ORAL | 0 refills | Status: DC

## 2021-06-11 NOTE — ED Provider Notes (Signed)
[facility]-EMERGENCY DEPT Provider Note   CSN: [id number] Arrival date & time: 06/11/21  1419     History  Chief Complaint  Patient presents with   Chest Pain   Dizziness   Headache    Taylor House is a 66 y.o. female.  66 year old female with prior medical history as detailed below presents for evaluation.  Patient complains of approximately 2 weeks of mild headache.  Headache is intermittent.  She complains of feeling dizzy.  This is also been going on for 2 weeks.  She complains of vague chest discomfort for the last 3 to 4 days.  She reports intermittent episodes of palpitations.  She denies associated nausea or vomiting.  She denies diaphoresis.  She denies shortness of breath.  She has noted that her blood pressure has been higher than her normal over the last 2 weeks.  She is reporting home blood pressure systolic readings into the 140s and 150s at home.  She is not currently taking antihypertensives.  She reports a distant use of antihypertensives.  She reports that with lifestyle modification she was able to stop taking these antihypertensives.  She is established with Deboraha [facility]angE[facility] as her PCP.  She admits that it has been more than 1 year since her last PCP appointment.  The history is provided by the patient and medical records.  Chest Pain Pain location:  L chest Pain quality: aching and pressure   Pain radiates to:  Does not radiate Pain severity:  Mild Onset quality:  Gradual Duration:  4 days Timing:  Constant Progression:  Waxing and waning Chronicity:  New Associated symptoms: dizziness and headache   Dizziness Associated symptoms: chest pain and headaches   Headache Associated symptoms: dizziness       Home Medications Prior to Admission medications   Medication Sig Start Date End Date Taking? Authorizing Provider  acetaminophen (TYLENOL) 500 MG tablet Take 1,500 mg by mouth every 6 (six) hours as needed for mild pain or headache.     [provider]  aspirin EC 325 MG tablet Take 325 mg by mouth once.    [provider]  omeprazole (PRILOSEC) 20 MG capsule Take 1 capsule (20 mg total) by mouth daily. 03/15/19   Palumbo, April, MD      Allergies    Penicillins    Review of Systems   Review of Systems  Cardiovascular:  Positive for chest pain.  Neurological:  Positive for dizziness and headaches.  All other systems reviewed and are negative.  Physical Exam Updated Vital Signs BP (!) 160/93 (BP Location: Left Arm)    Pulse 74    Temp 97.9 F (36.6 C) (Oral)    Resp 20    Ht 5' 4.5" (1.638 m)    Wt 98.4 kg    SpO2 98%    BMI 36.67 kg/m  Physical Exam Vitals and nursing note reviewed.  Constitutional:      General: She is not in acute distress.    Appearance: Normal appearance. She is well-developed.  HENT:     Head: Normocephalic and atraumatic.  Eyes:     Conjunctiva/sclera: Conjunctivae normal.     Pupils: Pupils are equal, round, and reactive to light.  Cardiovascular:     Rate and Rhythm: Normal rate and regular rhythm.     Heart sounds: Normal heart sounds.  Pulmonary:     Effort: Pulmonary effort is normal. No respiratory distress.     Breath sounds: Normal breath sounds.  Abdominal:  General: There is no distension.     Palpations: Abdomen is soft.     Tenderness: There is no abdominal tenderness.  Musculoskeletal:        General: No deformity. Normal range of motion.     Cervical back: Normal range of motion and neck supple.  Skin:    General: Skin is warm and dry.  Neurological:     General: No focal deficit present.     Mental Status: She is alert and oriented to person, place, and time.    ED Results / Procedures / Treatments   Labs (all labs ordered are listed, but only abnormal results are displayed) Labs Reviewed  BASIC METABOLIC PANEL  CBC  TSH  TROPONIN I (HIGH SENSITIVITY)    EKG EKG Interpretation  Date/Time:  Monday June 11 2021 15:10:53  EST Ventricular Rate:  75 PR Interval:  164 QRS Duration: 95 QT Interval:  387 QTC Calculation: 433 R Axis:   46 Text Interpretation: Sinus rhythm Probable left atrial enlargement Confirmed by Kristine Royal [id number]) on 06/11/2021 3:12:40 PM  Radiology No results found.  Procedures Procedures    Medications Ordered in ED Medications - No data to display  ED Course/ Medical Decision Making/ A&P                           Medical Decision Making Amount and/or Complexity of Data Reviewed Labs: ordered. Radiology: ordered.  Risk Prescription drug management.    Medical Screen Complete  This patient presented to the ED with complaint of dizziness, chest discomfort, nausea, palpitations.  This complaint involves an extensive number of treatment options. The initial differential diagnosis includes, but is not limited to, ACS, arrhythmia, metabolic abnormality, pneumonia, CVA, etc  This presentation is: Acute, Chronic, Previously Undiagnosed, Uncertain Prognosis, Complicated, Systemic Symptoms, and Threat to Life/Bodily Function  Patient presented with a host of complaints.  Primary complaints appear to be focused on atypical chest discomfort, dizziness, and intermittent nausea.  Patient without symptoms clearly suggestive of ACS.  EKG is without acute ischemia.  Troponin is barely detectable at 3.  Other screening labs obtained are without significant abnormality.  Patient is agreeable to trial of antiacid medications for treatment of possible GERD.  Patient's blood pressure improved without intervention.  Patient does understand need for close follow-up with PCPs office for further evaluation of her borderline hypertension and other symptoms.  Strict turn precautions given and understood.  Importance of close follow-up is stressed.   Co morbidities that complicated the patient's evaluation  Borderline hypertension that is untreated   Additional history  obtained:  External records from outside sources obtained and reviewed including prior ED visits and prior Inpatient records.    Lab Tests:  I ordered and personally interpreted labs.  The pertinent results include: CBC, CMP, TSH, troponin    Imaging Studies ordered:  I ordered imaging studies including CT head, chest x-ray I independently visualized and interpreted obtained imaging which showed no acute pathology I agree with the radiologist interpretation.   Cardiac Monitoring:  The patient was maintained on a cardiac monitor.  I personally viewed and interpreted the cardiac monitor which showed an underlying rhythm of: Normal sinus rhythm     Problem List / ED Course:  Borderline hypertension, atypical chest discomfort   Reevaluation:  After the interventions noted above, I reevaluated the patient and found that they have: resolved    Disposition:  After consideration of the diagnostic results  and the patients response to treatment, I feel that the patent would benefit from close outpatient follow-up.          Final Clinical Impression(s) / ED Diagnoses Final diagnoses:  Chest pain, unspecified type    Rx / DC Orders ED Discharge Orders          Ordered    famotidine (PEPCID) 20 MG tablet  2 times daily        06/11/21 1708    ondansetron (ZOFRAN) 4 MG tablet  Every 6 hours        06/11/21 1708              Wynetta Fines, MD 06/11/21 1712

## 2021-06-11 NOTE — Discharge Instructions (Addendum)
Return for any problem.   Follow-up closely with your regular care provider for further evaluation of your blood pressure and other issues as discussed.

## 2021-06-11 NOTE — ED Triage Notes (Addendum)
Patient c/o  intermittent "chest heaviness " x 2 weeks. Patient also c/o intermittent dizziness x 2-3 weeks.  Patient also rports she has had intermittent episodes  of "heart racing" at night.

## 2022-10-21 ENCOUNTER — Other Ambulatory Visit: Payer: Self-pay

## 2022-10-24 ENCOUNTER — Encounter: Payer: Self-pay | Admitting: Nurse Practitioner

## 2022-10-24 ENCOUNTER — Telehealth: Payer: Self-pay | Admitting: Hematology and Oncology

## 2022-10-24 NOTE — Telephone Encounter (Signed)
Spoke to patient to confirm upcoming afternoon [facility] clinic appointment on 6/19, paperwork will be sent via [employer].   Gave location and time, also informed patient that the surgeon's office would be calling as well to get information from them similar to the packet that they will be receiving so make sure to do both.  Reminded patient that all providers will be coming to the clinic to see them HERE and if they had any questions to not hesitate to reach back out to myself or their navigators.

## 2022-10-28 ENCOUNTER — Encounter: Payer: Self-pay | Admitting: *Deleted

## 2022-10-28 DIAGNOSIS — D0511 Intraductal carcinoma in situ of right breast: Secondary | ICD-10-CM | POA: Insufficient documentation

## 2022-10-29 NOTE — Progress Notes (Signed)
Radiation Oncology         [phone] ________________________________  Initial Outpatient Consultation  Name: Taylor House MRN: [id number]  Date: 10/30/2022  DOB: 06-Mar-1956  CC:Pcp, No  Griselda Miner, MD   REFERRING PHYSICIAN: Chevis Pretty III, MD  DIAGNOSIS: No diagnosis found.   Cancer Staging  No matching staging information was found for the patient.  Stage 0 (cTis (DCIS), cN0, cM0) Right Breast UOQ, Low-grade ductal carcinoma in situ, ER+ / PR+ / Her2 not assessed  CHIEF COMPLAINT: Here to discuss management of right breast DCIS  HISTORY OF PRESENT ILLNESS::Taylor House is a 67 y.o. female who presented with a right breast abnormality on the following imaging: bilateral screening mammogram on the date of 09/24/22. No symptoms, if any, were reported at that time. Right breast diagnostic mammogram on 10/01/22 demonstrated a indeterminate cluster of 6-7 mm equal density masses in the upper outer right breast, 6 cmfn, at a middle depth. No other significant masses or calcifications were appreciated. Right breast ultrasound performed on that same date (10/01/22) demonstrated a suspicious 0.8 x 0.4 x 0.7 cm partially solid mass in the 12 o'clock right breast at an anterior depth. No evidence of right axillary lymphadenopathy was appreciated.   Biopsy of the 12 o'clock right breast on date of 10/21/22 showed low-grade DCIS measuring 6 mm in the greatest linear extent of the sample.  ER status: 100% positive and PR status 100% positive, both with strong staining intensity; Her2 not assessed. No lymph nodes were examined   ***  PREVIOUS RADIATION THERAPY: No  PAST MEDICAL HISTORY:  has a past medical history of High cholesterol, Hypertension, and Hypothyroidism.    PAST SURGICAL HISTORY: Past Surgical History:  Procedure Laterality Date   ABDOMINAL HYSTERECTOMY     BREAST EXCISIONAL BIOPSY Left    benign   BREAST LUMPECTOMY Left     FAMILY HISTORY: family history includes  Breast cancer in her maternal aunt, maternal grandmother, and mother.  SOCIAL HISTORY:  reports that she has never smoked. She has never used smokeless tobacco. She reports that she does not drink alcohol and does not use drugs.  ALLERGIES: Penicillins  MEDICATIONS:  Current Outpatient Medications  Medication Sig Dispense Refill   acetaminophen (TYLENOL) 500 MG tablet Take 1,500 mg by mouth every 6 (six) hours as needed for mild pain or headache.     aspirin EC 325 MG tablet Take 325 mg by mouth once.     famotidine (PEPCID) 20 MG tablet Take 1 tablet (20 mg total) by mouth 2 (two) times daily. 30 tablet 0   omeprazole (PRILOSEC) 20 MG capsule Take 1 capsule (20 mg total) by mouth daily. 30 capsule 0   ondansetron (ZOFRAN) 4 MG tablet Take 1 tablet (4 mg total) by mouth every 6 (six) hours. 12 tablet 0   No current facility-administered medications for this encounter.    REVIEW OF SYSTEMS: As above in HPI.   PHYSICAL EXAM:  vitals were not taken for this visit.   General: Alert and oriented, in no acute distress HEENT: Head is normocephalic. Extraocular movements are intact. Oropharynx is clear. Neck: Neck is supple, no palpable cervical or supraclavicular lymphadenopathy. Heart: Regular in rate and rhythm with no murmurs, rubs, or gallops. Chest: Clear to auscultation bilaterally, with no rhonchi, wheezes, or rales. Abdomen: Soft, nontender, nondistended, with no rigidity or guarding. Extremities: No cyanosis or edema. Lymphatics: see Neck Exam Skin: No concerning lesions. Musculoskeletal: symmetric strength and muscle tone throughout. Neurologic:  Cranial nerves II through XII are grossly intact. No obvious focalities. Speech is fluent. Coordination is intact. Psychiatric: Judgment and insight are intact. Affect is appropriate. Breasts: *** . No other palpable masses appreciated in the breasts or axillae *** .    ECOG = ***  0 - Asymptomatic (Fully active, able to carry on all  predisease activities without restriction)  1 - Symptomatic but completely ambulatory (Restricted in physically strenuous activity but ambulatory and able to carry out work of a light or sedentary nature. For example, light housework, office work)  2 - Symptomatic, <50% in bed during the day (Ambulatory and capable of all self care but unable to carry out any work activities. Up and about more than 50% of waking hours)  3 - Symptomatic, >50% in bed, but not bedbound (Capable of only limited self-care, confined to bed or chair 50% or more of waking hours)  4 - Bedbound (Completely disabled. Cannot carry on any self-care. Totally confined to bed or chair)  5 - Death   Santiago Glad MM, Creech RH, Tormey DC, et al. [phone]). "Toxicity and response criteria of the [facility]". Am. Evlyn Clines. Oncol. 5 (6): 649-55   LABORATORY DATA:  Lab Results  Component Value Date   WBC 6.3 06/11/2021   HGB 12.3 06/11/2021   HCT 35.3 (L) 06/11/2021   MCV 109.6 (H) 06/11/2021   PLT 287 06/11/2021   CMP     Component Value Date/Time   NA 138 06/11/2021 1550   K 3.6 06/11/2021 1550   CL 106 06/11/2021 1550   CO2 26 06/11/2021 1550   GLUCOSE 106 (H) 06/11/2021 1550   BUN 12 06/11/2021 1550   CREATININE 0.70 06/11/2021 1550   CALCIUM 8.7 (L) 06/11/2021 1550   PROT 7.7 05/16/2016 1933   ALBUMIN 4.3 05/16/2016 1933   AST 28 05/16/2016 1933   ALT 26 05/16/2016 1933   ALKPHOS 66 05/16/2016 1933   BILITOT 0.8 05/16/2016 1933   GFRNONAA >60 06/11/2021 1550   GFRAA >60 03/15/2019 0407         RADIOGRAPHY: No results found.    IMPRESSION/PLAN: ***   It was a pleasure meeting the patient today. We discussed the risks, benefits, and side effects of radiotherapy. I recommend radiotherapy to the *** to reduce her risk of locoregional recurrence by 2/3.  We discussed that radiation would take approximately *** weeks to complete and that I would give the patient a few weeks to heal following  surgery before starting treatment planning. *** If chemotherapy were to be given, this would precede radiotherapy. We spoke about acute effects including skin irritation and fatigue as well as much less common late effects including internal organ injury or irritation. We spoke about the latest technology that is used to minimize the risk of late effects for patients undergoing radiotherapy to the breast or chest wall. No guarantees of treatment were given. The patient is enthusiastic about proceeding with treatment. I look forward to participating in the patient's care.  I will await her referral back to me for postoperative follow-up and eventual CT simulation/treatment planning.  On date of service, in total, I spent *** minutes on this encounter. Patient was seen in person.   __________________________________________   Lonie Peak, MD  This document serves as a record of services personally performed by Lonie Peak, MD. It was created on her behalf by Neena Rhymes, a trained medical scribe. The creation of this record is based on the scribe's personal  observations and the provider's statements to them. This document has been checked and approved by the attending provider.

## 2022-10-30 ENCOUNTER — Inpatient Hospital Stay (HOSPITAL_BASED_OUTPATIENT_CLINIC_OR_DEPARTMENT_OTHER): Payer: Medicare HMO | Admitting: Genetic Counselor

## 2022-10-30 ENCOUNTER — Inpatient Hospital Stay (HOSPITAL_BASED_OUTPATIENT_CLINIC_OR_DEPARTMENT_OTHER): Payer: Medicare HMO | Admitting: Hematology and Oncology

## 2022-10-30 ENCOUNTER — Encounter: Payer: Self-pay | Admitting: Radiation Oncology

## 2022-10-30 ENCOUNTER — Ambulatory Visit: Payer: Self-pay | Admitting: General Surgery

## 2022-10-30 ENCOUNTER — Ambulatory Visit: Payer: Self-pay | Admitting: Physical Therapy

## 2022-10-30 ENCOUNTER — Encounter: Payer: Self-pay | Admitting: Hematology and Oncology

## 2022-10-30 ENCOUNTER — Inpatient Hospital Stay: Payer: Medicare HMO | Attending: Hematology and Oncology

## 2022-10-30 ENCOUNTER — Other Ambulatory Visit: Payer: Self-pay

## 2022-10-30 ENCOUNTER — Ambulatory Visit
Admission: RE | Admit: 2022-10-30 | Discharge: 2022-10-30 | Disposition: A | Discharge status: Home / Self Care | Payer: Medicare HMO | Source: Ambulatory Visit | Attending: Radiation Oncology | Admitting: Radiation Oncology

## 2022-10-30 VITALS — BP 179/88 | HR 80 | Temp 97.2°F | Resp 18 | Ht 64.5 in | Wt 227.2 lb

## 2022-10-30 DIAGNOSIS — Z803 Family history of malignant neoplasm of breast: Secondary | ICD-10-CM | POA: Diagnosis not present

## 2022-10-30 DIAGNOSIS — D0511 Intraductal carcinoma in situ of right breast: Secondary | ICD-10-CM | POA: Insufficient documentation

## 2022-10-30 DIAGNOSIS — Z90710 Acquired absence of both cervix and uterus: Secondary | ICD-10-CM | POA: Insufficient documentation

## 2022-10-30 DIAGNOSIS — Z170 Estrogen receptor positive status [ER+]: Secondary | ICD-10-CM

## 2022-10-30 DIAGNOSIS — Z79899 Other long term (current) drug therapy: Secondary | ICD-10-CM

## 2022-10-30 DIAGNOSIS — Z880 Allergy status to penicillin: Secondary | ICD-10-CM | POA: Insufficient documentation

## 2022-10-30 LAB — GENETIC SCREENING ORDER

## 2022-10-30 LAB — CBC WITH DIFFERENTIAL (CANCER CENTER ONLY)
Abs Immature Granulocytes: 0.02 10*3/uL (ref 0.00–0.07)
Basophils Absolute: 0 10*3/uL (ref 0.0–0.1)
Basophils Relative: 1 %
Eosinophils Absolute: 0.1 10*3/uL (ref 0.0–0.5)
Eosinophils Relative: 1 %
HCT: 41.1 % (ref 36.0–46.0)
Hemoglobin: 13.2 g/dL (ref 12.0–15.0)
Immature Granulocytes: 0 %
Lymphocytes Relative: 22 %
Lymphs Abs: 1.4 10*3/uL (ref 0.7–4.0)
MCH: 27.7 pg (ref 26.0–34.0)
MCHC: 32.1 g/dL (ref 30.0–36.0)
MCV: 86.2 fL (ref 80.0–100.0)
Monocytes Absolute: 0.6 10*3/uL (ref 0.1–1.0)
Monocytes Relative: 9 %
Neutro Abs: 4.1 10*3/uL (ref 1.7–7.7)
Neutrophils Relative %: 67 %
Platelet Count: 248 10*3/uL (ref 150–400)
RBC: 4.77 MIL/uL (ref 3.87–5.11)
RDW: 15.2 % (ref 11.5–15.5)
WBC Count: 6.1 10*3/uL (ref 4.0–10.5)
nRBC: 0 % (ref 0.0–0.2)

## 2022-10-30 LAB — CMP (CANCER CENTER ONLY)
ALT: 16 U/L (ref 0–44)
AST: 14 U/L — ABNORMAL LOW (ref 15–41)
Albumin: 4.1 g/dL (ref 3.5–5.0)
Alkaline Phosphatase: 79 U/L (ref 38–126)
Anion gap: 5 (ref 5–15)
BUN: 9 mg/dL (ref 8–23)
CO2: 29 mmol/L (ref 22–32)
Calcium: 9.5 mg/dL (ref 8.9–10.3)
Chloride: 106 mmol/L (ref 98–111)
Creatinine: 0.76 mg/dL (ref 0.44–1.00)
GFR, Estimated: >60 mL/min (ref >60)
Glucose, Bld: 108 mg/dL — ABNORMAL HIGH (ref 70–99)
Potassium: 4.2 mmol/L (ref 3.5–5.1)
Sodium: 140 mmol/L (ref 135–145)
Total Bilirubin: 0.6 mg/dL (ref 0.3–1.2)
Total Protein: 7.3 g/dL (ref 6.5–8.1)

## 2022-10-30 MED ORDER — KETOROLAC TROMETHAMINE 15 MG/ML IJ SOLN
15.0000 mg | Freq: Once | INTRAMUSCULAR | Status: AC
Start: 2022-10-30 — End: 2022-11-04

## 2022-10-30 NOTE — Progress Notes (Unsigned)
REFERRING PROVIDER: Serena Croissant, MD [address] [place],  [place] [address]  PRIMARY PROVIDER:  None listed  PRIMARY REASON FOR VISIT:  1. Ductal carcinoma in situ (DCIS) of right breast   2. Family history of breast cancer     HISTORY OF PRESENT ILLNESS:   Taylor House, a 67 y.o. female, was seen for a [facility] cancer genetics consultation at the request of Dr. [name] due to a personal and family history of breast cancer.  Taylor House presents to clinic today to discuss the possibility of a hereditary predisposition to cancer, to discuss genetic testing, and to further clarify her future cancer risks, as well as potential cancer risks for family members.   In June 2024, at the age of 59, Taylor House was diagnosed with ductal carcinoma in situ of the right breast (ER+/PR+). The preliminary treatment plan includes lumpectomy, adjuvant radiation, and anti-estrogens.    CANCER HISTORY:  Oncology History  Ductal carcinoma in situ (DCIS) of right breast  10/21/2022 Initial Diagnosis   Screening mammogram detected conglomeration of 3 masses right breast upper outer quadrant: 0.8 cm at 10:00: Stable, 0.6 cm: Cyst, 0.8 cm at 12:00: Low-grade DCIS ER 100%, PR 100%   10/30/2022 Cancer Staging   Staging form: Breast, AJCC 8th Edition - Clinical: Stage 0 (cTis (DCIS), cN0, cM0, G1, ER+, PR+, HER2: Not Assessed) - Signed by Serena Croissant, MD on 10/30/2022 Histologic grading system: 3 grade system      Past Medical History:  Diagnosis Date   High cholesterol    Hypertension    Hypothyroidism     Past Surgical History:  Procedure Laterality Date   ABDOMINAL HYSTERECTOMY     BREAST EXCISIONAL BIOPSY Left    benign   BREAST LUMPECTOMY Left     FAMILY HISTORY:  We obtained a detailed, 4-generation family history.  Significant diagnoses are listed below: Family History  Problem Relation Age of Onset   Breast cancer Mother 92       mets   Breast cancer Maternal Aunt         dx before 50   Prostate cancer Maternal Uncle        mets   Breast cancer Maternal Grandmother 42   Breast cancer Half-Sister 4       stage IV      Ms. Semrad is unaware of previous family history of genetic testing for hereditary cancer risks.  She stated that she grew up in the foster care system but had regular contact with many maternal biological relatives.  She does not have any information about paternal family.   There is no reported Ashkenazi Jewish ancestry.   GENETIC COUNSELING ASSESSMENT: Taylor House is a 67 y.o. female with a personal and family history of breast cancer which is somewhat suggestive of a hereditary cancer syndrome and predisposition to cancer given the presence of breast cancer before age 27 in multiple generations. We, therefore, discussed and recommended the following at today's visit.   DISCUSSION: We discussed that 5 - 10% of cancer is hereditary.  Most cases of hereditary breast cancer are associated with mutations in BRCA1/2.  There are other genes that can be associated with hereditary breast cancer syndromes.  We discussed that testing is beneficial for several reasons including knowing how to follow individuals for their cancer risks, identifying whether potential treatment options would be beneficial, and understanding if other family members could be at risk for cancer and allowing them to undergo genetic testing.  We reviewed the characteristics, features and inheritance patterns of hereditary cancer syndromes. We also discussed genetic testing, including the appropriate family members to test, the process of testing, insurance coverage and turn-around-time for results. We discussed the implications of a negative, positive, carrier and/or variant of uncertain significant result. We recommended Ms. Makino pursue genetic testing for a panel that includes genes associated with breast cancer and other cancers.    The [facility] Common Hereditary  Cancers + RNA Panel includes sequencing, deletion/duplication, and RNA analysis of the following 48 genes: APC, ATM, AXIN2, BAP1, BARD1, BMPR1A, BRCA1, BRCA2, BRIP1, CDH1, CDK4*, CDKN2A*, CHEK2, CTNNA1, DICER1, EPCAM* (del/dup only), FH, GREM1* (promoter dup analysis only), HOXB13*, KIT*, MBD4*, MEN1, MLH1, MSH2, MSH3, MSH6, MUTYH, NF1, NTHL1, PALB2, PDGFRA*, PMS2, POLD1, POLE, PTEN, RAD51C, RAD51D, SDHA (sequencing only), SDHB, SDHC, SDHD, SMAD4, SMARCA4, STK11, TP53, TSC1, TSC2, VHL.  *Genes without RNA analysis.    Based on Taylor House's personal and family history of breast cancer, she meets medical criteria for genetic testing. Despite that she meets criteria, she may still have an out of pocket cost. We discussed that if her out of pocket cost for testing is over $100, the laboratory should contact her and discuss the self-pay prices and/or patient pay assistance programs.    PLAN: After considering the risks, benefits, and limitations, Ms. Smuck provided informed consent to pursue genetic testing and the blood sample was sent to [facility] for analysis of the Common Hereditary Cancers +RNA Panel. Results should be available within approximately 3 weeks' time, at which point they will be disclosed by telephone to Taylor House, as will any additional recommendations warranted by these results. Taylor House will receive a summary of her genetic counseling visit and a copy of her results once available. This information will also be available in Epic.   Taylor House questions were answered to her satisfaction today. Our contact information was provided should additional questions or concerns arise. Thank you for the referral and allowing [place] to share in the care of your patient.   Maximo Spratling M. Rennie Plowman, MS, [facility] Genetic Counselor [email or web address] [phone]  The patient was seen for a total of 15 minutes in face-to-face genetic counseling.  She was accompanied by her son and  daughter.  Dr. [name] was available to discuss this case as needed.    _______________________________________________________________________ For Office Staff:  Number of people involved in session: 3 Was an Intern/ student involved with case: no

## 2022-10-30 NOTE — Assessment & Plan Note (Signed)
10/21/2022:Screening mammogram detected conglomeration of 3 masses right breast upper outer quadrant: 0.8 cm at 10:00: Stable, 0.6 cm: Cyst, 0.8 cm at 12:00: Low-grade DCIS ER 100%, PR 100%  Pathology review: I discussed with the patient the difference between DCIS and invasive breast cancer. It is considered a precancerous lesion. DCIS is classified as a 0. It is generally detected through mammograms as calcifications. We discussed the significance of grades and its impact on prognosis. We also discussed the importance of ER and PR receptors and their implications to adjuvant treatment options. Prognosis of DCIS dependence on grade, comedo necrosis. It is anticipated that if not treated, 20-30% of DCIS can develop into invasive breast cancer.  Recommendation: 1. Breast conserving surgery 2. Followed by adjuvant radiation therapy 3. Followed by antiestrogen therapy with tamoxifen 5 years  Tamoxifen counseling: We discussed the risks and benefits of tamoxifen. These include but not limited to insomnia, hot flashes, mood changes, vaginal dryness, and weight gain. Although rare, serious side effects including endometrial cancer, risk of blood clots were also discussed. We strongly believe that the benefits far outweigh the risks. Patient understands these risks and consented to starting treatment. Planned treatment duration is 5 years.  Return to clinic after surgery to discuss the final pathology report and come up with an adjuvant treatment plan.

## 2022-10-30 NOTE — Progress Notes (Signed)
[facility] CONSULT NOTE  Patient Care Team: Pcp, No as PCP - General Griselda Miner, MD as Consulting Physician (General Surgery) Serena Croissant, MD as Consulting Physician (Hematology and Oncology) Lonie Peak, MD as Attending Physician (Radiation Oncology) Donnelly Angelica, RN as Oncology Nurse Navigator Pershing Proud, RN as Oncology Nurse Navigator  CHIEF COMPLAINTS/PURPOSE OF CONSULTATION:  Newly diagnosed right breast DCIS  HISTORY OF PRESENTING ILLNESS:  [place] House 67 y.o. female is here because of recent diagnosis of right breast DCIS.  Patient had routine screening mammogram that detected a conglomeration of 3 masses in the right breast 2 of which were cystic and stable finding but a third lesion was 0.8 cm at 12 o'clock position which on biopsy came back as low-grade DCIS that was ER/PR positive.  She was presented this morning in the multidisciplinary tumor board and she is here today accompanied by her family to discuss treatment plan.  I reviewed her records extensively and collaborated the history with the patient.  SUMMARY OF ONCOLOGIC HISTORY: Oncology History  Ductal carcinoma in situ (DCIS) of right breast  10/21/2022 Initial Diagnosis   Screening mammogram detected conglomeration of 3 masses right breast upper outer quadrant: 0.8 cm at 10:00: Stable, 0.6 cm: Cyst, 0.8 cm at 12:00: Low-grade DCIS ER 100%, PR 100%   10/30/2022 Cancer Staging   Staging form: Breast, AJCC 8th Edition - Clinical: Stage 0 (cTis (DCIS), cN0, cM0, G1, ER+, PR+, HER2: Not Assessed) - Signed by Serena Croissant, MD on 10/30/2022 Histologic grading system: 3 grade system      MEDICAL HISTORY:  Past Medical History:  Diagnosis Date   Breast cancer (HCC)    High cholesterol    Hypertension    Hypothyroidism     SURGICAL HISTORY: Past Surgical History:  Procedure Laterality Date   ABDOMINAL HYSTERECTOMY     BREAST EXCISIONAL BIOPSY Left    benign   BREAST LUMPECTOMY  Left     SOCIAL HISTORY: Social History   Socioeconomic History   Marital status: Single    Spouse name: Not on file   Number of children: Not on file   Years of education: Not on file   Highest education level: Not on file  Occupational History   Not on file  Tobacco Use   Smoking status: Never   Smokeless tobacco: Never  Vaping Use   Vaping Use: Never used  Substance and Sexual Activity   Alcohol use: No   Drug use: No   Sexual activity: Not on file  Other Topics Concern   Not on file  Social History Narrative   Not on file   Social Determinants of Health   Financial Resource Strain: Not on file  Food Insecurity: Not on file  Transportation Needs: Not on file  Physical Activity: Not on file  Stress: Not on file  Social Connections: Not on file  Intimate Partner Violence: Not on file    FAMILY HISTORY: Family History  Problem Relation Age of Onset   Breast cancer Mother    Breast cancer Maternal Grandmother    Breast cancer Maternal Aunt     ALLERGIES:  is allergic to penicillins.  MEDICATIONS:  Current Outpatient Medications  Medication Sig Dispense Refill   acetaminophen (TYLENOL) 500 MG tablet Take 1,500 mg by mouth every 6 (six) hours as needed for mild pain or headache.     atorvastatin (LIPITOR) 20 MG tablet Take 20 mg by mouth daily.     carvedilol (  COREG) 6.25 MG tablet Take 6.25 mg by mouth 2 (two) times daily with a meal.     diclofenac Sodium (VOLTAREN) 1 % GEL Apply topically as needed.     Efinaconazole (JUBLIA) 10 % SOLN Apply 10 % topically daily. Applied to toe     levothyroxine (SYNTHROID) 25 MCG tablet Take 25 mcg by mouth daily before breakfast.     aspirin EC 325 MG tablet Take 325 mg by mouth once. (Patient not taking: Reported on 10/30/2022)     famotidine (PEPCID) 20 MG tablet Take 1 tablet (20 mg total) by mouth 2 (two) times daily. (Patient not taking: Reported on 10/30/2022) 30 tablet 0   omeprazole (PRILOSEC) 20 MG capsule Take 1  capsule (20 mg total) by mouth daily. (Patient not taking: Reported on 10/30/2022) 30 capsule 0   No current facility-administered medications for this visit.   Facility-Administered Medications Ordered in Other Visits  Medication Dose Route Frequency Provider Last Rate Last Admin   ketorolac (TORADOL) 15 MG/ML injection 15 mg  15 mg Intravenous Once Chevis Pretty III, MD        REVIEW OF SYSTEMS:   Constitutional: Denies fevers, chills or abnormal night sweats   All other systems were reviewed with the patient and are negative.  PHYSICAL EXAMINATION: ECOG PERFORMANCE STATUS: 1 - Symptomatic but completely ambulatory  Vitals:   10/30/22 1301  BP: (!) 179/88  Pulse: 80  Resp: 18  Temp: (!) 97.2 F (36.2 C)  SpO2: 100%   Filed Weights   10/30/22 1301  Weight: 227 lb 3.2 oz (103.1 kg)    GENERAL:alert, no distress and comfortable    LABORATORY DATA:  I have reviewed the data as listed Lab Results  Component Value Date   WBC 6.1 10/30/2022   HGB 13.2 10/30/2022   HCT 41.1 10/30/2022   MCV 86.2 10/30/2022   PLT 248 10/30/2022   Lab Results  Component Value Date   NA 140 10/30/2022   K 4.2 10/30/2022   CL 106 10/30/2022   CO2 29 10/30/2022    RADIOGRAPHIC STUDIES: I have personally reviewed the radiological reports and agreed with the findings in the report.  ASSESSMENT AND PLAN:  Ductal carcinoma in situ (DCIS) of right breast 10/21/2022:Screening mammogram detected conglomeration of 3 masses right breast upper outer quadrant: 0.8 cm at 10:00: Stable, 0.6 cm: Cyst, 0.8 cm at 12:00: Low-grade DCIS ER 100%, PR 100%  Pathology review: I discussed with the patient the difference between DCIS and invasive breast cancer. It is considered a precancerous lesion. DCIS is classified as a 0. It is generally detected through mammograms as calcifications. We discussed the significance of grades and its impact on prognosis. We also discussed the importance of ER and PR receptors  and their implications to adjuvant treatment options. Prognosis of DCIS dependence on grade, comedo necrosis. It is anticipated that if not treated, 20-30% of DCIS can develop into invasive breast cancer.  Recommendation: 1. Breast conserving surgery 2. Followed by adjuvant radiation therapy 3. Followed by antiestrogen therapy with tamoxifen 5 years  Tamoxifen counseling: We discussed the risks and benefits of tamoxifen. These include but not limited to insomnia, hot flashes, mood changes, vaginal dryness, and weight gain. Although rare, serious side effects including endometrial cancer, risk of blood clots were also discussed. We strongly believe that the benefits far outweigh the risks. Patient understands these risks and consented to starting treatment. Planned treatment duration is 5 years.  Return to clinic after surgery to  discuss the final pathology report and come up with an adjuvant treatment plan.   All questions were answered. The patient knows to call the clinic with any problems, questions or concerns.    [name], MD 10/30/22

## 2022-10-31 ENCOUNTER — Encounter: Payer: Self-pay | Admitting: *Deleted

## 2022-10-31 ENCOUNTER — Telehealth: Payer: Self-pay | Admitting: *Deleted

## 2022-10-31 ENCOUNTER — Encounter: Payer: Self-pay | Admitting: Genetic Counselor

## 2022-10-31 DIAGNOSIS — D0511 Intraductal carcinoma in situ of right breast: Secondary | ICD-10-CM

## 2022-10-31 NOTE — Telephone Encounter (Signed)
Pt request 2nd opinion at [facility]. MR will be sent to to [facility] and request for mdc appt

## 2022-11-08 ENCOUNTER — Encounter: Payer: Self-pay | Admitting: General Practice

## 2022-11-08 NOTE — Progress Notes (Signed)
[facility] Multidisciplinary Clinic Spiritual Care Note  Met with Klarisa by phone following Breast Multidisciplinary Clinic to introduce Support Center team/resources. She completed SDOH screening; results follow below.    SDOH Screenings   Food Insecurity: Food Insecurity Present (11/08/2022)  Housing: Low Risk  (11/08/2022)  Transportation Needs: No Transportation Needs (11/08/2022)  Utilities: Not At Risk (11/08/2022)  Depression (PHQ2-9): Low Risk  (11/08/2022)  Tobacco Use: Low Risk  (10/31/2022)    Chaplain and patient discussed common feelings and emotions when being diagnosed with cancer, and the importance of support during treatment.  Chaplain informed patient of the support team and support services at [facility].  Chaplain provided contact information and encouraged patient to call with any questions or concerns.  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 She also voiced concerns about maintaining a safe level of stamina for doing her job through treatment.  Ms Lograsso describes herself as "normally such an upbeat person" who is busy socially and states that her scores on the depression screening are primarily due to distress about finances secondary to treatment.  Will refer to Social Work regarding possible eligibility for financial resources.  Follow up needed: No. Ms Voncannon knows to contact chaplain as needed/desired for follow-up support.   [address] [place], [place], [facility] Pager [phone] Voicemail [phone]

## 2022-11-11 ENCOUNTER — Inpatient Hospital Stay: Payer: Medicare HMO | Attending: Hematology and Oncology | Admitting: Licensed Clinical Social Worker

## 2022-11-11 ENCOUNTER — Telehealth: Payer: Self-pay | Admitting: Licensed Clinical Social Worker

## 2022-11-11 NOTE — Telephone Encounter (Signed)
[facility] Clinical Social Work  Clinical Social Work was referred by spiritual care for assessment of financial needs.  Clinical Social Worker attempted to contact patient by phone to offer support and assess for needs.   No answer. Left VM with direct contact information.     Babs Dabbs E Gerri Acre, LCSW  Clinical Social Worker [facility]

## 2022-11-11 NOTE — Progress Notes (Signed)
[facility] Clinical Social Work  Initial Assessment   Karely Lammert is a 67 y.o. year old female contacted by phone. Clinical Social Work was referred by spiritual care for assessment of psychosocial needs.   SDOH (Social Determinants of Health) assessments performed: Yes SDOH Interventions    Flowsheet Row Clinical Support from 11/11/2022 in [facility] at [facility]  SDOH Interventions   Financial Strain Interventions Financial Counselor, Other (Comment)  [cancer foundations]       SDOH Screenings   Food Insecurity: Food Insecurity Present (11/08/2022)  Housing: Low Risk  (11/08/2022)  Transportation Needs: No Transportation Needs (11/08/2022)  Utilities: Not At Risk (11/08/2022)  Depression (PHQ2-9): Low Risk  (11/08/2022)  Financial Resource Strain: Medium Risk (11/11/2022)  Tobacco Use: Low Risk  (10/31/2022)    Family/Social Information:  Housing Arrangement: patient lives with her daughter . 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 Pt also receives social security.  Income source: Employment and Actor concerns: Yes, due to illness and/or loss of work during treatment Type of concern: Utilities and Biomedical scientist access concerns: no Religious or spiritual practice: Not known Services Currently in place:  [employer]  Coping/ Adjustment to diagnosis: Patient understands treatment plan and what happens next? 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: Actuary Current coping skills/ strengths: Ability for insight , Manufacturing systems engineer , and Motivation  for treatment/growth     SUMMARY: Current SDOH Barriers:  Financial constraints related to loss of work during treatment  Clinical Social Work Clinical Goal(s):  Patient will work with cancer foundations and grants to address needs related to financial strain  Interventions: Provided CSW contact information and encouraged patient to call with any questions or concerns Conservation officer, historic buildings for cancer foundations ([place] in [place] and [employer]) as well as information on [employer] and [employer]   Follow Up Plan: Patient will work on applications and notify CSW of questions and for help submitting once pt parts and supporting documents are ready Patient verbalizes understanding of plan: Yes    Yui Mulvaney E Fawne Hughley, LCSW Clinical Child psychotherapist [facility]

## 2022-11-12 ENCOUNTER — Ambulatory Visit: Payer: Self-pay | Admitting: Genetic Counselor

## 2022-11-12 ENCOUNTER — Encounter: Payer: Self-pay | Admitting: *Deleted

## 2022-11-12 ENCOUNTER — Encounter: Payer: Self-pay | Admitting: Genetic Counselor

## 2022-11-12 ENCOUNTER — Telehealth: Payer: Self-pay | Admitting: Genetic Counselor

## 2022-11-12 DIAGNOSIS — Z803 Family history of malignant neoplasm of breast: Secondary | ICD-10-CM

## 2022-11-12 DIAGNOSIS — D0511 Intraductal carcinoma in situ of right breast: Secondary | ICD-10-CM

## 2022-11-12 DIAGNOSIS — Z1379 Encounter for other screening for genetic and chromosomal anomalies: Secondary | ICD-10-CM | POA: Insufficient documentation

## 2022-11-12 NOTE — Progress Notes (Signed)
Sent out records for a second opinion on 6/20, faxed to [phone], confirmation received, following up today to see if appointment has been made

## 2022-11-12 NOTE — Telephone Encounter (Signed)
Disclosed negative genetics.    

## 2022-11-12 NOTE — Progress Notes (Signed)
HPI:   Ms. Bord was previously seen in the [facility] due to a personal and family history of breast cancer and concerns regarding a hereditary predisposition to cancer.    Ms. Veltri recent genetic test results were disclosed to her by telephone. These results and recommendations are discussed in more detail below.  CANCER HISTORY:  Oncology History  Ductal carcinoma in situ (DCIS) of right breast  10/21/2022 Initial Diagnosis   Screening mammogram detected conglomeration of 3 masses right breast upper outer quadrant: 0.8 cm at 10:00: Stable, 0.6 cm: Cyst, 0.8 cm at 12:00: Low-grade DCIS ER 100%, PR 100%   10/30/2022 Cancer Staging   Staging form: Breast, AJCC 8th Edition - Clinical: Stage 0 (cTis (DCIS), cN0, cM0, G1, ER+, PR+, HER2: Not Assessed) - Signed by Serena Croissant, MD on 10/30/2022 Histologic grading system: 3 grade system   11/12/2022 Genetic Testing   Negative [facility] Common Hereditary Cancers +RNA Panel.  Report date is 11/12/2022.   The [facility] Common Hereditary Cancers + RNA Panel includes sequencing, deletion/duplication, and RNA analysis of the following 48 genes: APC, ATM, AXIN2, BAP1, BARD1, BMPR1A, BRCA1, BRCA2, BRIP1, CDH1, CDK4*, CDKN2A*, CHEK2, CTNNA1, DICER1, EPCAM* (del/dup only), FH, GREM1* (promoter dup analysis only), HOXB13*, KIT*, MBD4*, MEN1, MLH1, MSH2, MSH3, MSH6, MUTYH, NF1, NTHL1, PALB2, PDGFRA*, PMS2, POLD1, POLE, PTEN, RAD51C, RAD51D, SDHA (sequencing only), SDHB, SDHC, SDHD, SMAD4, SMARCA4, STK11, TP53, TSC1, TSC2, VHL.  *Genes without RNA analysis.        FAMILY HISTORY:  We obtained a detailed, 4-generation family history.  Significant diagnoses are listed below:      Family History  Problem Relation Age of Onset   Breast cancer Mother 45        mets   Breast cancer Maternal Aunt          dx before 50   Prostate cancer Maternal Uncle          mets   Breast cancer Maternal Grandmother 29   Breast cancer Half-Sister  19        stage IV         Ms. Levitz is unaware of previous family history of genetic testing for hereditary cancer risks.  She stated that she grew up in the foster care system but had regular contact with many maternal biological relatives.  She does not have any information about paternal family.    There is no reported Ashkenazi Jewish ancestry.   GENETIC TEST RESULTS:  The [facility] Common Hereditary Cancers +RNA Panel found no pathogenic mutations.    The [facility] Common Hereditary Cancers + RNA Panel includes sequencing, deletion/duplication, and RNA analysis of the following 48 genes: APC, ATM, AXIN2, BAP1, BARD1, BMPR1A, BRCA1, BRCA2, BRIP1, CDH1, CDK4*, CDKN2A*, CHEK2, CTNNA1, DICER1, EPCAM* (del/dup only), FH, GREM1* (promoter dup analysis only), HOXB13*, KIT*, MBD4*, MEN1, MLH1, MSH2, MSH3, MSH6, MUTYH, NF1, NTHL1, PALB2, PDGFRA*, PMS2, POLD1, POLE, PTEN, RAD51C, RAD51D, SDHA (sequencing only), SDHB, SDHC, SDHD, SMAD4, SMARCA4, STK11, TP53, TSC1, TSC2, VHL.  *Genes without RNA analysis.  .   The test report has been scanned into EPIC and is located under the Molecular Pathology section of the Results Review tab.  A portion of the result report is included below for reference. Genetic testing reported out on November 12, 2022.      Even though a pathogenic variant was not identified, possible explanations for the cancer in the family may include: There may be no hereditary risk for cancer in the  family. The cancers in Ms. Felmlee and/or her family may be sporadic/familial or due to other genetic and environmental factors. There may be a gene mutation in one of these genes that current testing methods cannot detect but that chance is small. There could be another gene that has not yet been discovered, or that we have not yet tested, that is responsible for the cancer diagnoses in the family.  It is also possible there is a hereditary cause for the cancer in the family that Ms. Borell  did not inherit.   Therefore, it is important to remain in touch with cancer genetics in the future so that we can continue to offer Ms. Schilder the most up to date genetic testing.      ADDITIONAL GENETIC TESTING:   Ms. Rahaim genetic testing was fairly extensive.  If there are additional relevant genes identified to increase cancer risk that can be analyzed in the future, we would be happy to discuss and coordinate this testing at that time.     CANCER SCREENING RECOMMENDATIONS:  Ms. Balsiger test result is considered negative (normal).  This means that we have not identified a hereditary cause for her personal history of breast cancer at this time.   An individual's cancer risk and medical management are not determined by genetic test results alone. Overall cancer risk assessment incorporates additional factors, including personal medical history, family history, and any available genetic information that may result in a personalized plan for cancer prevention and surveillance. Therefore, it is recommended she continue to follow the cancer management and screening guidelines provided by her oncology and primary healthcare provider.  RECOMMENDATIONS FOR FAMILY MEMBERS:   Since she did not inherit a identifiable mutation in a cancer predisposition gene included on this panel, her children could not have inherited a known mutation from her in one of these genes. Individuals in this family might be at some increased risk of developing cancer, over the general population risk, due to the family history of cancer.  Individuals in the family should notify their providers of the family history of cancer. We recommend women in this family have a yearly mammogram beginning at age 58, or 55 years younger than the earliest onset of cancer, an annual clinical breast exam, and perform monthly breast self-exams.  Risk models that take into account family history and hormonal history may be helpful in  determining appropriate breast cancer screening options for family members.  Other members of the family may still carry a pathogenic variant in one of these genes that Ms. Channel did not inherit. Based on the family history, we recommend her maternal half sister, who was diagnosed with breast cancer at age 65, have genetic counseling and testing. Ms. Hux can let us know if we can be of any assistance in coordinating genetic counseling and/or testing for these family member.     FOLLOW-UP:  Cancer genetics is a rapidly advancing field and it is possible that new genetic tests will be appropriate for her and/or her family members in the future. We encourage Ms. Pasko to remain in contact with cancer genetics, so we can update her personal and family histories and let her know of advances in cancer genetics that may benefit this family.   Our contact number was provided.  She knows she is welcome to call us at anytime with additional questions or concerns.   Kendarius Vigen M. Rennie Plowman, MS, [facility] Genetic Counselor [email or web address] (P) [phone]

## 2022-11-13 ENCOUNTER — Telehealth: Payer: Self-pay | Admitting: Radiation Oncology

## 2022-11-13 NOTE — Telephone Encounter (Signed)
Called patient to schedule a consultation w. Dr. Squire. No answer, LVM for a return call.  

## 2022-11-15 ENCOUNTER — Telehealth: Payer: Self-pay | Admitting: Hematology and Oncology

## 2022-11-15 NOTE — Telephone Encounter (Signed)
Patient is aware of upcoming appointment times/dates.  

## 2022-11-22 NOTE — Progress Notes (Signed)
Surgical Instructions   Your procedure is scheduled on July 22. Report to [facility] Main Entrance "A" at 11 A.M., then check in with the Admitting office. Any questions or running late day of surgery: call [phone]  Questions prior to your surgery date: call [phone], Monday-Friday, 8am-4pm. If you experience any cold or flu symptoms such as cough, fever, chills, shortness of breath, etc. between now and your scheduled surgery, please notify us at the above number.     Remember:  Do not eat after midnight the night before your surgery   You may drink clear liquids until 10 am the morning of your surgery.   Clear liquids allowed are: Water, Non-Citrus Juices (without pulp), Carbonated Beverages, Clear Tea, Black Coffee Only (NO MILK, CREAM OR POWDERED CREAMER of any kind), and Gatorade.    Take these medicines the morning of surgery with A SIP OF WATER  atorvastatin (LIPITOR) carvedilol (COREG) levothyroxine (SYNTHROID)   May take these medicines IF NEEDED: acetaminophen (TYLENOL)    One week prior to surgery, STOP taking any Aspirin (unless otherwise instructed by your surgeon) Aleve, Naproxen, Ibuprofen, Motrin, Advil, Goody's, BC's, all herbal medications, fish oil, and non-prescription vitamins.                     Do NOT Smoke (Tobacco/Vaping) for 24 hours prior to your procedure.  If you use a CPAP at night, you may bring your mask/headgear for your overnight stay.   You will be asked to remove any contacts, glasses, piercing's, hearing aid's, dentures/partials prior to surgery. Please bring cases for these items if needed.    Patients discharged the day of surgery will not be allowed to drive home, and someone needs to stay with them for 24 hours.  SURGICAL WAITING ROOM VISITATION Patients may have no more than 2 support people in the waiting area - these visitors may rotate.   Pre-op nurse will coordinate an appropriate time for 1 ADULT support person, who may  not rotate, to accompany patient in pre-op.  Children under the age of 102 must have an adult with them who is not the patient and must remain in the main waiting area with an adult.  If the patient needs to stay at the hospital during part of their recovery, the visitor guidelines for inpatient rooms apply.  Please refer to the [facility] website for the visitor guidelines for any additional information.   If you received a COVID test during your pre-op visit  it is requested that you wear a mask when out in public, stay away from anyone that may not be feeling well and notify your surgeon if you develop symptoms. If you have been in contact with anyone that has tested positive in the last 10 days please notify you surgeon.      Pre-operative CHG Bathing Instructions   You can play a key role in reducing the risk of infection after surgery. Your skin needs to be as free of germs as possible. You can reduce the number of germs on your skin by washing with CHG (chlorhexidine gluconate) soap before surgery. CHG is an antiseptic soap that kills germs and continues to kill germs even after washing.   DO NOT use if you have an allergy to chlorhexidine/CHG or antibacterial soaps. If your skin becomes reddened or irritated, stop using the CHG and notify one of our RNs at [phone].  TAKE A SHOWER THE NIGHT BEFORE SURGERY AND THE DAY OF SURGERY    Please keep in mind the following:  DO NOT shave, including legs and underarms, 48 hours prior to surgery.   You may shave your face before/day of surgery.  Place clean sheets on your bed the night before surgery Use a clean washcloth (not used since being washed) for each shower. DO NOT sleep with pet's night before surgery.  CHG Shower Instructions:  If you choose to wash your hair and private area, wash first with your normal shampoo/soap.  After you use shampoo/soap, rinse your hair and body thoroughly to remove shampoo/soap  residue.  Turn the water OFF and apply half the bottle of CHG soap to a CLEAN washcloth.  Apply CHG soap ONLY FROM YOUR NECK DOWN TO YOUR TOES (washing for 3-5 minutes)  DO NOT use CHG soap on face, private areas, open wounds, or sores.  Pay special attention to the area where your surgery is being performed.  If you are having back surgery, having someone wash your back for you may be helpful. Wait 2 minutes after CHG soap is applied, then you may rinse off the CHG soap.  Pat dry with a clean towel  Put on clean clothes/pajamas    Additional instructions for the day of surgery: DO NOT APPLY any lotions, deodorants, cologne, or perfumes.   Do not wear jewelry or makeup Do not wear nail polish, gel polish, artificial nails, or any other type of covering on natural nails (fingers and toes) Do not bring valuables to the hospital. [facility] is not responsible for valuables/personal belongings. Put on clean/comfortable clothes.  Please brush your teeth.  Ask your nurse before applying any prescription medications to the skin.

## 2022-11-25 ENCOUNTER — Other Ambulatory Visit: Payer: Self-pay

## 2022-11-25 ENCOUNTER — Encounter (HOSPITAL_COMMUNITY): Payer: Self-pay

## 2022-11-25 ENCOUNTER — Encounter (HOSPITAL_COMMUNITY)
Admission: RE | Admit: 2022-11-25 | Discharge: 2022-11-25 | Disposition: A | Discharge status: Home / Self Care | Payer: Medicare HMO | Source: Ambulatory Visit | Attending: General Surgery | Admitting: General Surgery

## 2022-11-25 VITALS — BP 162/87 | HR 74 | Temp 98.2°F | Resp 16 | Ht 64.0 in | Wt 228.0 lb

## 2022-11-25 DIAGNOSIS — Z01818 Encounter for other preprocedural examination: Secondary | ICD-10-CM | POA: Insufficient documentation

## 2022-11-25 HISTORY — DX: Anemia, unspecified: D64.9

## 2022-11-25 HISTORY — DX: Unspecified osteoarthritis, unspecified site: M19.90

## 2022-11-25 LAB — BASIC METABOLIC PANEL
Anion gap: 8 (ref 5–15)
BUN: 8 mg/dL (ref 8–23)
CO2: 24 mmol/L (ref 22–32)
Calcium: 9.1 mg/dL (ref 8.9–10.3)
Chloride: 108 mmol/L (ref 98–111)
Creatinine, Ser: 0.81 mg/dL (ref 0.44–1.00)
GFR, Estimated: >60 mL/min (ref >60)
Glucose, Bld: 87 mg/dL (ref 70–99)
Potassium: 3.8 mmol/L (ref 3.5–5.1)
Sodium: 140 mmol/L (ref 135–145)

## 2022-11-25 LAB — CBC
HCT: 40.7 % (ref 36.0–46.0)
Hemoglobin: 12.8 g/dL (ref 12.0–15.0)
MCH: 27.3 pg (ref 26.0–34.0)
MCHC: 31.4 g/dL (ref 30.0–36.0)
MCV: 86.8 fL (ref 80.0–100.0)
Platelets: 262 10*3/uL (ref 150–400)
RBC: 4.69 MIL/uL (ref 3.87–5.11)
RDW: 14.7 % (ref 11.5–15.5)
WBC: 5.8 10*3/uL (ref 4.0–10.5)
nRBC: 0 % (ref 0.0–0.2)

## 2022-11-25 NOTE — Progress Notes (Signed)
PCP - Debria Garret, NP- [facility] Cardiologist - denies  PPM/ICD - denies Device Orders -  Rep Notified -   Chest x-ray - denies EKG - 11/25/22 Stress Test - none ECHO - TTE- 11/05/21 Cardiac Cath -   Sleep Study - none CPAP -   Fasting Blood Sugar - na Checks Blood Sugar _____ times a day  Last dose of GLP1 agonist-  na GLP1 instructions:   Blood Thinner Instructions:na Aspirin Instructions:na  ERAS Protcol -clear liquids until 1000 PRE-SURGERY Ensure or G2- no  COVID TEST- no   Anesthesia review: yes- seed placement  Patient denies shortness of breath, fever, cough and chest pain at PAT appointment   All instructions explained to the patient, with a verbal understanding of the material. Patient agrees to go over the instructions while at home for a better understanding. Patient also instructed to wear a mask when out in public prior to surgery. The opportunity to ask questions was provided.

## 2022-11-26 ENCOUNTER — Encounter: Payer: Self-pay | Admitting: *Deleted

## 2022-11-29 ENCOUNTER — Telehealth: Payer: Self-pay | Admitting: Hematology and Oncology

## 2022-11-29 NOTE — Telephone Encounter (Signed)
Rescheduled appointment per staff message. Patient is aware of the changes made to her upcoming appointment. 

## 2022-12-12 ENCOUNTER — Inpatient Hospital Stay
Admission: RE | Admit: 2022-12-12 | Discharge: 2022-12-12 | Disposition: A | Discharge status: Home / Self Care | Payer: Self-pay | Source: Ambulatory Visit | Attending: Radiation Oncology | Admitting: Radiation Oncology

## 2022-12-12 ENCOUNTER — Other Ambulatory Visit: Payer: Self-pay | Admitting: Radiation Oncology

## 2022-12-12 ENCOUNTER — Ambulatory Visit: Payer: Medicare HMO | Admitting: Hematology and Oncology

## 2022-12-12 DIAGNOSIS — D0511 Intraductal carcinoma in situ of right breast: Secondary | ICD-10-CM

## 2022-12-16 ENCOUNTER — Encounter (HOSPITAL_BASED_OUTPATIENT_CLINIC_OR_DEPARTMENT_OTHER): Payer: Self-pay | Admitting: General Surgery

## 2022-12-23 ENCOUNTER — Ambulatory Visit (HOSPITAL_COMMUNITY)
Admission: RE | Admit: 2022-12-23 | Discharge: 2022-12-23 | Disposition: A | Discharge status: Home / Self Care | Payer: Medicare HMO | Attending: General Surgery | Admitting: General Surgery

## 2022-12-23 ENCOUNTER — Other Ambulatory Visit: Payer: Self-pay

## 2022-12-23 ENCOUNTER — Ambulatory Visit (HOSPITAL_BASED_OUTPATIENT_CLINIC_OR_DEPARTMENT_OTHER): Payer: Medicare HMO | Admitting: Certified Registered"

## 2022-12-23 ENCOUNTER — Encounter (HOSPITAL_BASED_OUTPATIENT_CLINIC_OR_DEPARTMENT_OTHER)
Admission: RE | Disposition: A | Discharge status: Home / Self Care | Payer: Self-pay | Source: Home / Self Care | Attending: General Surgery

## 2022-12-23 ENCOUNTER — Ambulatory Visit (HOSPITAL_BASED_OUTPATIENT_CLINIC_OR_DEPARTMENT_OTHER): Payer: Medicare HMO | Admitting: Physician Assistant

## 2022-12-23 ENCOUNTER — Encounter (HOSPITAL_BASED_OUTPATIENT_CLINIC_OR_DEPARTMENT_OTHER): Payer: Self-pay | Admitting: General Surgery

## 2022-12-23 DIAGNOSIS — D0511 Intraductal carcinoma in situ of right breast: Secondary | ICD-10-CM

## 2022-12-23 DIAGNOSIS — E785 Hyperlipidemia, unspecified: Secondary | ICD-10-CM | POA: Diagnosis not present

## 2022-12-23 DIAGNOSIS — E039 Hypothyroidism, unspecified: Secondary | ICD-10-CM | POA: Diagnosis not present

## 2022-12-23 DIAGNOSIS — N6011 Diffuse cystic mastopathy of right breast: Secondary | ICD-10-CM | POA: Diagnosis not present

## 2022-12-23 DIAGNOSIS — Z803 Family history of malignant neoplasm of breast: Secondary | ICD-10-CM | POA: Insufficient documentation

## 2022-12-23 DIAGNOSIS — N6089 Other benign mammary dysplasias of unspecified breast: Secondary | ICD-10-CM | POA: Insufficient documentation

## 2022-12-23 DIAGNOSIS — I10 Essential (primary) hypertension: Secondary | ICD-10-CM | POA: Diagnosis not present

## 2022-12-23 HISTORY — PX: BREAST LUMPECTOMY WITH RADIOACTIVE SEED LOCALIZATION: SHX6424

## 2022-12-23 SURGERY — BREAST LUMPECTOMY WITH RADIOACTIVE SEED LOCALIZATION
Anesthesia: General | Site: Breast | Laterality: Right

## 2022-12-23 MED ORDER — MIDAZOLAM HCL 2 MG/2ML IJ SOLN
INTRAMUSCULAR | Status: AC
  Filled 2022-12-23: qty 2

## 2022-12-23 MED ORDER — ONDANSETRON HCL 4 MG/2ML IJ SOLN
INTRAMUSCULAR | Status: AC
  Filled 2022-12-23: qty 2

## 2022-12-23 MED ORDER — FENTANYL CITRATE (PF) 100 MCG/2ML IJ SOLN
INTRAMUSCULAR | Status: DC | PRN
  Administered 2022-12-23 (×2): 50 ug via INTRAVENOUS

## 2022-12-23 MED ORDER — MIDAZOLAM HCL 5 MG/5ML IJ SOLN
INTRAMUSCULAR | Status: DC | PRN
  Administered 2022-12-23: 2 mg via INTRAVENOUS

## 2022-12-23 MED ORDER — DEXAMETHASONE SODIUM PHOSPHATE 10 MG/ML IJ SOLN
INTRAMUSCULAR | Status: DC | PRN
  Administered 2022-12-23: 10 mg via INTRAVENOUS

## 2022-12-23 MED ORDER — EPHEDRINE SULFATE (PRESSORS) 50 MG/ML IJ SOLN
INTRAMUSCULAR | Status: DC | PRN
  Administered 2022-12-23: 10 mg via INTRAVENOUS
  Administered 2022-12-23: 5 mg via INTRAVENOUS

## 2022-12-23 MED ORDER — CHLORHEXIDINE GLUCONATE CLOTH 2 % EX PADS: 6.0000 | MEDICATED_PAD | Freq: Once | CUTANEOUS | Status: DC

## 2022-12-23 MED ORDER — PROPOFOL 10 MG/ML IV BOLUS
INTRAVENOUS | Status: DC | PRN
Start: 2022-12-23 — End: 2022-12-23
  Administered 2022-12-23: 200 mg via INTRAVENOUS
  Administered 2022-12-23: 50 mg via INTRAVENOUS

## 2022-12-23 MED ORDER — ORAL CARE MOUTH RINSE: 15.0000 mL | Freq: Once | OROMUCOSAL | Status: DC

## 2022-12-23 MED ORDER — IBUPROFEN 600 MG PO TABS
600.0000 mg | ORAL_TABLET | Freq: Once | ORAL | Status: AC
  Administered 2022-12-23: 600 mg via ORAL

## 2022-12-23 MED ORDER — OXYCODONE HCL 5 MG/5ML PO SOLN: 5.0000 mg | Freq: Once | ORAL | Status: DC | PRN

## 2022-12-23 MED ORDER — ACETAMINOPHEN 500 MG PO TABS
ORAL_TABLET | ORAL | Status: AC
  Filled 2022-12-23: qty 2

## 2022-12-23 MED ORDER — VANCOMYCIN HCL IN DEXTROSE 1-5 GM/200ML-% IV SOLN
1000.0000 mg | INTRAVENOUS | Status: AC
  Administered 2022-12-23: 1000 mg via INTRAVENOUS

## 2022-12-23 MED ORDER — FENTANYL CITRATE (PF) 100 MCG/2ML IJ SOLN: 25.0000 ug | INTRAMUSCULAR | Status: DC | PRN

## 2022-12-23 MED ORDER — BUPIVACAINE-EPINEPHRINE (PF) 0.25% -1:200000 IJ SOLN
INTRAMUSCULAR | Status: DC | PRN
  Administered 2022-12-23: 10 mL

## 2022-12-23 MED ORDER — ONDANSETRON HCL 4 MG/2ML IJ SOLN
INTRAMUSCULAR | Status: DC | PRN
  Administered 2022-12-23: 4 mg via INTRAVENOUS

## 2022-12-23 MED ORDER — OXYCODONE HCL 5 MG PO TABS: 5.0000 mg | ORAL_TABLET | Freq: Once | ORAL | Status: DC | PRN

## 2022-12-23 MED ORDER — OXYCODONE HCL 5 MG PO TABS: 5.0000 mg | ORAL_TABLET | Freq: Four times a day (QID) | ORAL | 0 refills | Status: DC | PRN

## 2022-12-23 MED ORDER — ACETAMINOPHEN 500 MG PO TABS
1000.0000 mg | ORAL_TABLET | ORAL | Status: AC
  Administered 2022-12-23: 1000 mg via ORAL

## 2022-12-23 MED ORDER — GABAPENTIN 300 MG PO CAPS
ORAL_CAPSULE | ORAL | Status: AC
  Filled 2022-12-23: qty 1

## 2022-12-23 MED ORDER — VANCOMYCIN HCL IN DEXTROSE 1-5 GM/200ML-% IV SOLN
INTRAVENOUS | Status: AC
  Filled 2022-12-23: qty 200

## 2022-12-23 MED ORDER — LACTATED RINGERS IV SOLN: INTRAVENOUS | Status: DC

## 2022-12-23 MED ORDER — ONDANSETRON HCL 4 MG/2ML IJ SOLN: 4.0000 mg | Freq: Once | INTRAMUSCULAR | Status: DC | PRN

## 2022-12-23 MED ORDER — CHLORHEXIDINE GLUCONATE 0.12 % MT SOLN: 15.0000 mL | Freq: Once | OROMUCOSAL | Status: DC

## 2022-12-23 MED ORDER — DEXAMETHASONE SODIUM PHOSPHATE 10 MG/ML IJ SOLN
INTRAMUSCULAR | Status: AC
  Filled 2022-12-23: qty 1

## 2022-12-23 MED ORDER — IBUPROFEN 200 MG PO TABS
ORAL_TABLET | ORAL | Status: AC
  Filled 2022-12-23: qty 3

## 2022-12-23 MED ORDER — LIDOCAINE HCL (CARDIAC) PF 100 MG/5ML IV SOSY
PREFILLED_SYRINGE | INTRAVENOUS | Status: DC | PRN
  Administered 2022-12-23: 60 mg via INTRAVENOUS

## 2022-12-23 MED ORDER — GABAPENTIN 300 MG PO CAPS
300.0000 mg | ORAL_CAPSULE | ORAL | Status: AC
  Administered 2022-12-23: 300 mg via ORAL

## 2022-12-23 MED ORDER — FENTANYL CITRATE (PF) 100 MCG/2ML IJ SOLN
INTRAMUSCULAR | Status: AC
  Filled 2022-12-23: qty 2

## 2022-12-23 SURGICAL SUPPLY — 39 items
ADH SKN CLS APL DERMABOND .7 (GAUZE/BANDAGES/DRESSINGS) ×1
APL PRP STRL LF DISP 70% ISPRP (MISCELLANEOUS) ×1
APPLIER CLIP 9.375 MED OPEN (MISCELLANEOUS) ×1
APR CLP MED 9.3 20 MLT OPN (MISCELLANEOUS) ×1
BLADE SURG 15 STRL LF DISP TIS (BLADE) ×1 IMPLANT
BLADE SURG 15 STRL SS (BLADE) ×1
CANISTER SUC SOCK COL 7IN (MISCELLANEOUS) ×1 IMPLANT
CANISTER SUCT 1200ML W/VALVE (MISCELLANEOUS) ×1 IMPLANT
CHLORAPREP W/TINT 26 (MISCELLANEOUS) ×1 IMPLANT
CLIP APPLIE 9.375 MED OPEN (MISCELLANEOUS) IMPLANT
COVER BACK TABLE 60X90IN (DRAPES) ×1 IMPLANT
COVER MAYO STAND STRL (DRAPES) ×1 IMPLANT
COVER PROBE CYLINDRICAL 5X96 (MISCELLANEOUS) ×1 IMPLANT
DERMABOND ADVANCED .7 DNX12 (GAUZE/BANDAGES/DRESSINGS) ×1 IMPLANT
DRAPE LAPAROSCOPIC ABDOMINAL (DRAPES) ×1 IMPLANT
DRAPE UTILITY XL STRL (DRAPES) ×1 IMPLANT
ELECT COATED BLADE 2.86 ST (ELECTRODE) ×1 IMPLANT
ELECT REM PT RETURN 9FT ADLT (ELECTROSURGICAL) ×1
ELECTRODE REM PT RTRN 9FT ADLT (ELECTROSURGICAL) ×1 IMPLANT
GLOVE BIO SURGEON STRL SZ7.5 (GLOVE) ×2 IMPLANT
GOWN STRL REUS W/ TWL LRG LVL3 (GOWN DISPOSABLE) ×2 IMPLANT
GOWN STRL REUS W/TWL LRG LVL3 (GOWN DISPOSABLE) ×2
KIT MARKER MARGIN INK (KITS) ×1 IMPLANT
NDL HYPO 25X1 1.5 SAFETY (NEEDLE) IMPLANT
NEEDLE HYPO 25X1 1.5 SAFETY (NEEDLE) ×1
NS IRRIG 1000ML POUR BTL (IV SOLUTION) IMPLANT
PACK BASIN DAY SURGERY FS (CUSTOM PROCEDURE TRAY) ×1 IMPLANT
PENCIL SMOKE EVACUATOR (MISCELLANEOUS) ×1 IMPLANT
SLEEVE SCD COMPRESS KNEE MED (STOCKING) ×1 IMPLANT
SPIKE FLUID TRANSFER (MISCELLANEOUS) IMPLANT
SPONGE T-LAP 18X18 ~~LOC~~+RFID (SPONGE) ×1 IMPLANT
SUT MON AB 4-0 PC3 18 (SUTURE) ×1 IMPLANT
SUT SILK 2 0 SH (SUTURE) IMPLANT
SUT VICRYL 3-0 CR8 SH (SUTURE) ×1 IMPLANT
SYR CONTROL 10ML LL (SYRINGE) IMPLANT
TOWEL GREEN STERILE FF (TOWEL DISPOSABLE) ×1 IMPLANT
TRAY FAXITRON CT DISP (TRAY / TRAY PROCEDURE) ×1 IMPLANT
TUBE CONNECTING 20X1/4 (TUBING) ×1 IMPLANT
YANKAUER SUCT BULB TIP NO VENT (SUCTIONS) IMPLANT

## 2022-12-23 NOTE — Anesthesia Procedure Notes (Signed)
Procedure Name: LMA Insertion Date/Time: 12/23/2022 2:57 PM  Performed by: Thornell Mule, CRNAPre-anesthesia Checklist: Patient identified, Emergency Drugs available, Suction available and Patient being monitored Patient Re-evaluated:Patient Re-evaluated prior to induction Oxygen Delivery Method: Circle system utilized Preoxygenation: Pre-oxygenation with 100% oxygen Induction Type: IV induction LMA: LMA inserted LMA Size: 4.0 Number of attempts: 1 Placement Confirmation: positive ETCO2 Tube secured with: Tape Dental Injury: Teeth and Oropharynx as per pre-operative assessment

## 2022-12-23 NOTE — Transfer of Care (Signed)
Immediate Anesthesia Transfer of Care Note  Patient: Taylor House  Procedure(s) Performed: RIGHT BREAST LUMPECTOMY WITH RADIOACTIVE SEED LOCALIZATION (Right: Breast)  Patient Location: PACU  Anesthesia Type:General  Level of Consciousness: drowsy and patient cooperative  Airway & Oxygen Therapy: Patient Spontanous Breathing and Patient connected to face mask oxygen  Post-op Assessment: Report given to RN and Post -op Vital signs reviewed and stable  Post vital signs: Reviewed and stable  Last Vitals:  Vitals Value Taken Time  BP 150/90 12/23/22 1543  Temp    Pulse 91 12/23/22 1544  Resp 19 12/23/22 1544  SpO2 99 % 12/23/22 1544  Vitals shown include unfiled device data.  Last Pain:  Vitals:   12/23/22 1321  TempSrc: Temporal  PainSc: 0-No pain      Patients Stated Pain Goal: 3 (12/23/22 1321)  Complications: No notable events documented.

## 2022-12-23 NOTE — Anesthesia Preprocedure Evaluation (Addendum)
Anesthesia Evaluation  Patient identified by MRN, date of birth, ID band Patient awake    Reviewed: Allergy & Precautions, NPO status , Patient's Chart, lab work & pertinent test results  Airway Mallampati: II       Dental no notable dental hx. (+) Teeth Intact, Caps, Dental Advisory Given   Pulmonary neg pulmonary ROS   Pulmonary exam normal breath sounds clear to auscultation       Cardiovascular hypertension, Normal cardiovascular exam Rhythm:Regular Rate:Normal     Neuro/Psych negative neurological ROS  negative psych ROS   GI/Hepatic negative GI ROS, Neg liver ROS,,,  Endo/Other  Hypothyroidism  Obesity HLD DCIS right breast  Renal/GU negative Renal ROS  negative genitourinary   Musculoskeletal  (+) Arthritis , Osteoarthritis,    Abdominal  (+) + obese  Peds  Hematology  (+) Blood dyscrasia, anemia   Anesthesia Other Findings   Reproductive/Obstetrics                              Anesthesia Physical Anesthesia Plan  ASA: 2  Anesthesia Plan: General   Post-op Pain Management: Minimal or no pain anticipated   Induction: Intravenous  PONV Risk Score and Plan: 4 or greater and Treatment may vary due to age or medical condition, Ondansetron and Dexamethasone  Airway Management Planned: LMA  Additional Equipment: None  Intra-op Plan:   Post-operative Plan: Extubation in OR  Informed Consent: I have reviewed the patients History and Physical, chart, labs and discussed the procedure including the risks, benefits and alternatives for the proposed anesthesia with the patient or authorized representative who has indicated his/her understanding and acceptance.     Dental advisory given  Plan Discussed with: Anesthesiologist and CRNA  Anesthesia Plan Comments:          Anesthesia Quick Evaluation

## 2022-12-23 NOTE — Interval H&P Note (Signed)
History and Physical Interval Note:  12/23/2022 2:41 PM  Taylor House  has presented today for surgery, with the diagnosis of RIGHT BREAST DUCTAL CARCINOMA IN SITU.  The various methods of treatment have been discussed with the patient and family. After consideration of risks, benefits and other options for treatment, the patient has consented to  Procedure(s): RIGHT BREAST LUMPECTOMY WITH RADIOACTIVE SEED LOCALIZATION (Right) as a surgical intervention.  The patient's history has been reviewed, patient examined, no change in status, stable for surgery.  I have reviewed the patient's chart and labs.  Questions were answered to the patient's satisfaction.     Chevis Pretty III

## 2022-12-23 NOTE — Anesthesia Postprocedure Evaluation (Signed)
Anesthesia Post Note  Patient: Taylor House  Procedure(s) Performed: RIGHT BREAST LUMPECTOMY WITH RADIOACTIVE SEED LOCALIZATION (Right: Breast)     Patient location during evaluation: PACU Anesthesia Type: General Level of consciousness: awake and alert and oriented Pain management: pain level controlled Vital Signs Assessment: post-procedure vital signs reviewed and stable Respiratory status: spontaneous breathing, nonlabored ventilation and respiratory function stable Cardiovascular status: blood pressure returned to baseline and stable Postop Assessment: no apparent nausea or vomiting Anesthetic complications: no   No notable events documented.  Last Vitals:  Vitals:   12/23/22 1600 12/23/22 1615  BP: (!) 149/96 131/82  Pulse: 85 80  Resp: 13 17  Temp:    SpO2: 99% 96%    Last Pain:  Vitals:   12/23/22 1615  TempSrc:   PainSc: 0-No pain                 Paizley Ramella A.

## 2022-12-23 NOTE — H&P (Signed)
REFERRING PHYSICIAN: [name], MD PROVIDER: [name], MD MRN: [id number] DOB: [birth year] Subjective   Chief Complaint: No chief complaint on file.  History of Present Illness: Taylor House is a 67 y.o. female who is seen today as an office consultation for evaluation of No chief complaint on file.  We are asked to see the patient in consultation by Dr. [name] to evaluate her for a new right breast cancer. The patient is a 67 year old black female who recently went for a routine screening mammogram. At that time she was found to have a cluster of 3 spots in the upper outer quadrant of the right breast. 2 were benign. The third was an 8 mm mass. 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 Her axilla looked normal. She is otherwise in pretty good health other than some hypertension and does not smoke. She has a significant family history of breast cancer in her sister, mother, grandmother, and maternal aunt.  Review of Systems: A complete review of systems was obtained from the patient. I have reviewed this information and discussed as appropriate with the patient. See HPI as well for other ROS.  ROS   Medical History: Past Medical History:  Diagnosis Date  Hyperlipidemia  Hypertension  Thyroid disease   Patient Active Problem List  Diagnosis  Ductal carcinoma in situ (DCIS) of right breast   Past Surgical History:  Procedure Laterality Date  BREAST EXCISIONAL BIOPSY  breast lumpectomy surgery  HYSTERECTOMY    Allergies  Allergen Reactions  Penicillins Other (See Comments) and Rash  Did it involve swelling of the face/tongue/throat, SOB, or low BP? No Did it involve sudden or severe rash/hives, skin peeling, or any reaction on the inside of your mouth or nose? Yes Did you need to seek medical attention at a hospital or doctor's office? No When did it last happen? unknown  If all above answers are "NO", may  proceed with cephalosporin use.  Did it involve swelling of the face/tongue/throat, SOB, or low BP? No, Did it involve sudden or severe rash/hives, skin peeling, or any reaction on the inside of your mouth or nose? Yes, Did you need to seek medical attention at a hospital or doctor's office? No, When did it last happen? unknown , If all above answers are "NO", may proceed with cephalosporin use.   Current Outpatient Medications on File Prior to Visit  Medication Sig Dispense Refill  acetaminophen (TYLENOL) 500 MG tablet Take 500 mg by mouth every 6 (six) hours as needed  aspirin 325 MG EC tablet Take 325 mg by mouth once daily  famotidine (PEPCID) 20 MG tablet Take 20 mg by mouth 2 (two) times daily  omeprazole (PRILOSEC OTC) 20 MG EC tablet Take 20 mg by mouth once daily  ondansetron (ZOFRAN) 4 MG tablet Take 4 mg by mouth every 6 (six) hours as needed   No current facility-administered medications on file prior to visit.   Family History  Problem Relation Age of Onset  Breast cancer Mother    Social History   Tobacco Use  Smoking Status Never  Smokeless Tobacco Never    Social History   Socioeconomic History  Marital status: Single  Tobacco Use  Smoking status: Never  Smokeless tobacco: Never  Vaping Use  Vaping status: Never Used  Substance and Sexual Activity  Alcohol use: Not Currently  Drug use: Never   Social Determinants of Health   Food  Insecurity: Food Insecurity Present (03/05/2022)  Received from [facility] prior to 07/13/2022., [facility]  Hunger Vital Sign  Worried About Running Out of Food in the Last Year: Sometimes true  Ran Out of Food in the Last Year: Never true  Transportation Needs: No Transportation Needs (03/05/2022)  Received from [facility] visits prior to 07/13/2022., [facility]  PRAPARE - Transportation  Lack of Transportation (Medical): No  Lack of Transportation (Non-Medical): No    Objective:  There were no vitals filed for this visit.  There is no height or weight on file to calculate BMI.  Physical Exam Vitals reviewed.  Constitutional:  General: She is not in acute distress. Appearance: Normal appearance.  HENT:  Head: Normocephalic and atraumatic.  Right Ear: External ear normal.  Left Ear: External ear normal.  Nose: Nose normal.  Mouth/Throat:  Mouth: Mucous membranes are moist.  Pharynx: Oropharynx is clear.  Eyes:  General: No scleral icterus. Extraocular Movements: Extraocular movements intact.  Conjunctiva/sclera: Conjunctivae normal.  Pupils: Pupils are equal, round, and reactive to light.  Cardiovascular:  Rate and Rhythm: Normal rate and regular rhythm.  Pulses: Normal pulses.  Heart sounds: Normal heart sounds.  Pulmonary:  Effort: Pulmonary effort is normal. No respiratory distress.  Breath sounds: Normal breath sounds.  Abdominal:  General: Bowel sounds are normal.  Palpations: Abdomen is soft.  Tenderness: There is no abdominal tenderness.  Musculoskeletal:  General: No swelling, tenderness or deformity. Normal range of motion.  Cervical back: Normal range of motion and neck supple.  Skin: General: Skin is warm and dry.  Coloration: Skin is not jaundiced.  Neurological:  General: No focal deficit present.  Mental Status: She is alert and oriented to person, place, and time.  Psychiatric:  Mood and Affect: Mood normal.  Behavior: Behavior normal.     Breast: There is no palpable mass in either breast. There is no palpable axillary, supraclavicular, or cervical lymphadenopathy.  Labs, Imaging and Diagnostic Testing:  Assessment and Plan:   Diagnoses and all orders for this visit:  Ductal carcinoma in situ (DCIS) of right breast   The patient appears to have an 8 mm area of ductal carcinoma in situ in the upper portion of the right breast with favorable markers and clinically negative nodes. I have discussed with her  the different options for treatment and at this point she seems to favor breast conservation which I feel is very reasonable. She will not need a node evaluation. I have discussed with her in detail the risk and benefits of the operation as well as some of the technical aspects including use of a radioactive seed for localization and she understands. Her son would like [place] to arrange for a second opinion at [facility] before she will schedule surgery. She will also meet with medical and radiation oncology to discuss adjuvant therapy. I will have everything ready and wait for her to notify us when she is ready to schedule

## 2022-12-23 NOTE — Discharge Instructions (Addendum)
  Ibuprofen 600 mg given at 4:54 p.m. Tylenol taken at 1:30 pm. May repeat at 7:30pm as needed.   Post Anesthesia Home Care Instructions  Activity: Get plenty of rest for the remainder of the day. A responsible individual must stay with you for 24 hours following the procedure.  For the next 24 hours, DO NOT: -Drive a car -Advertising copywriter -Drink alcoholic beverages -Take any medication unless instructed by your physician -Make any legal decisions or sign important papers.  Meals: Start with liquid foods such as gelatin or soup. Progress to regular foods as tolerated. Avoid greasy, spicy, heavy foods. If nausea and/or vomiting occur, drink only clear liquids until the nausea and/or vomiting subsides. Call your physician if vomiting continues.  Special Instructions/Symptoms: Your throat may feel dry or sore from the anesthesia or the breathing tube placed in your throat during surgery. If this causes discomfort, gargle with warm salt water. The discomfort should disappear within 24 hours.  If you had a scopolamine patch placed behind your ear for the management of post- operative nausea and/or vomiting:  1. The medication in the patch is effective for 72 hours, after which it should be removed.  Wrap patch in a tissue and discard in the trash. Wash hands thoroughly with soap and water. 2. You may remove the patch earlier than 72 hours if you experience unpleasant side effects which may include dry mouth, dizziness or visual disturbances. 3. Avoid touching the patch. Wash your hands with soap and water after contact with the patch.

## 2022-12-23 NOTE — Op Note (Signed)
12/23/2022  3:38 PM  PATIENT:  Taylor House  67 y.o. female  PRE-OPERATIVE DIAGNOSIS:  RIGHT BREAST DUCTAL CARCINOMA IN SITU  POST-OPERATIVE DIAGNOSIS:  RIGHT BREAST DUCTAL CARCINOMA IN SITU  PROCEDURE:  Procedure(s): RIGHT BREAST LUMPECTOMY WITH RADIOACTIVE SEED LOCALIZATION (Right)  SURGEON:  Surgeons and Role:    Griselda Miner, MD - Primary  PHYSICIAN ASSISTANT:   ASSISTANTS: none   ANESTHESIA:   local and general  EBL:  5 mL   BLOOD ADMINISTERED:none  DRAINS: none   LOCAL MEDICATIONS USED:  MARCAINE     SPECIMEN:  Source of Specimen:  right breast tissue with additional superior margin  DISPOSITION OF SPECIMEN:  PATHOLOGY  COUNTS:  YES  TOURNIQUET:  * No tourniquets in log *  DICTATION: .Dragon Dictation  After informed consent was obtained the patient was brought to the operating room and placed in the supine position on the operating room table.  After adequate induction of general anesthesia the patient's right breast was prepped with ChloraPrep, allowed to dry, and draped in usual sterile manner.  An appropriate timeout was performed.  Previously in I-125 seed was placed in the upper central right breast to mark an area of ductal carcinoma in situ.  The neoprobe was set to I-125 in the area of radioactivity was readily identified.  The area around this was infiltrated with quarter percent Marcaine.  A curvilinear incision was then made with a 15 blade knife along the upper edge of the areola of the right breast.  The incision was carried through the skin and subcutaneous tissue sharply with the electrocautery.  Dissection was then carried towards the radioactive seed under the direction of the neoprobe.  Once I more closely approach the radioactive seed I then removed a circular portion of breast tissue sharply with the electrocautery around the radioactive seed while checking the area of radioactivity frequently.  Once the specimen was removed it was oriented with  the appropriate paint colors.  A specimen radiograph was obtained that showed the clip and seed to be near the center of the specimen.  I did elect to take an additional superior margin based on the images and palpation of the cavity.  This was marked appropriately and all the tissue was sent to pathology for further evaluation.  Hemostasis was achieved using the Bovie electrocautery.  The wound was irrigated with saline and infiltrated with more quarter percent Marcaine.  The cavity was marked with clips.  The deep layer of the incision was then closed with layers of interrupted 3-0 Vicryl stitches.  The skin was then closed with interrupted 4-0 Monocryl subcuticular stitches.  Dermabond dressings were applied.  The patient tolerated the procedure well.  At the end of the case all needle sponge and instrument counts were correct.  The patient was then awakened and taken to recovery in stable condition.  PLAN OF CARE: Discharge to home after PACU  PATIENT DISPOSITION:  PACU - hemodynamically stable.   Delay start of Pharmacological VTE agent (>24hrs) due to surgical blood loss or risk of bleeding: not applicable

## 2022-12-24 ENCOUNTER — Encounter (HOSPITAL_BASED_OUTPATIENT_CLINIC_OR_DEPARTMENT_OTHER): Payer: Self-pay | Admitting: General Surgery

## 2022-12-30 ENCOUNTER — Ambulatory Visit: Payer: Medicare HMO

## 2022-12-30 ENCOUNTER — Telehealth: Payer: Self-pay

## 2022-12-30 ENCOUNTER — Ambulatory Visit: Payer: Medicare HMO | Admitting: Radiation Oncology

## 2022-12-30 ENCOUNTER — Telehealth: Payer: Self-pay | Admitting: Hematology and Oncology

## 2022-12-30 ENCOUNTER — Encounter: Payer: Self-pay | Admitting: *Deleted

## 2022-12-30 NOTE — Telephone Encounter (Signed)
 Rescheduled appointment per patients request via incoming call. Patient is aware of the changes made to her upcoming appointment.

## 2022-12-30 NOTE — Telephone Encounter (Signed)
Pt called and LVM asking for a call to have someone translate medical jargon from her pathology report via surgery.   She is scheduled to f/u with Dr [name] 8/26. LVM for pt to call office back to further discuss and advise MD would further discuss at 8/26 appt.

## 2023-01-06 ENCOUNTER — Ambulatory Visit: Payer: Medicare HMO | Admitting: Hematology and Oncology

## 2023-01-06 ENCOUNTER — Inpatient Hospital Stay: Payer: Medicare HMO | Attending: Hematology and Oncology | Admitting: Hematology and Oncology

## 2023-01-06 NOTE — Progress Notes (Addendum)
Location of Breast Cancer: DCIS right breast  Histology per Pathology Report:  12-23-22 FINAL MICROSCOPIC DIAGNOSIS:  A. BREAST, RIGHT, LUMPECTOMY: Ductal carcinoma in situ, type solid and cribriform, nuclear grade 2 of 3, with focal necrosis DCIS greatest dimension: 9 x 7 x 6 mm Margins: Negative     Closest margin: Superior 10 mm Prognostic markers: ER positive, PR positive Biopsy site present Other findings: See oncology table  B. BREAST, RIGHT SUPERIOR MARGIN, EXCISION: -  Benign breast tissue with papillary apocrine metaplasia and fibrocystic change (new margin thickness 0.7 cm).  ONCOLOGY TABLE:  DCIS OF THE BREAST:  Resection Procedure: Lumpectomy with reexcised right superior margin Specimen Laterality: Right Histologic Type: Ductal carcinoma in situ, solid and cribriform type with focal necrosis Size of DCIS: 9 x 7 x 6 mm Nuclear Grade: 2 of 3 Necrosis: Focal Margins: All margins negative      Specify Closest Margin (required only if <24mm): Superior at 10 mm with new excised superior margin Regional Lymph Nodes: N/A; no lymph nodes submitted or found      Number of Lymph Nodes Examined: 0      Number of Sentinel Nodes Examined (if applicable): 0      Number of Lymph Nodes with Macrometastases: N/A      Number of Lymph Nodes with Micrometastases): N/A      Number of Lymph Nodes with Isolated Tumor Cells (=0.2 mm or =200 cells): N/A      Extranodal Extension: N/A Breast Biomarker Testing Performed on Previous Biopsy:      Testing performed on Case Number: [id number]      Estrogen Receptor: Positive, 100% strong      Progesterone Receptor: Positive, 100% strong Pathologic Stage Classification (pTNM, AJCC 8th Edition): pTis, pN X Representative Tumor Block: A6 Comment(s): Confirmatory immunohistochemical stains were performed on blocks A5 and A6 and show retained myoepithelial cells on the DCIS (calponin, smooth muscle myosin and p63  reviewed). (v4.4.0.0)  Receptor Status: ER(100%), PR (100%), Her2-neu (), Ki-67()  Did patient present with symptoms (if so, please note symptoms) or was this found on screening mammography?: screening mammogram  Past/Anticipated interventions by surgeon, if any: [name], MD - 10/30/2022  History of Present Illness: Taylor House is a 67 y.o. female who is seen today as an office consultation for evaluation of No chief complaint on file.  We are asked to see the patient in consultation by Dr. [name] to evaluate her for a new right breast cancer. The patient is a 67 year old black female who recently went for a routine screening mammogram. At that time she was found to have a cluster of 3 spots in the upper outer quadrant of the right breast. 2 were benign. The third was an 8 mm mass. 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 Her axilla looked normal. She is otherwise in pretty good health other than some hypertension and does not smoke. She has a significant family history of breast cancer in her sister, mother, grandmother, and maternal aunt.   Assessment and Plan:   Diagnoses and all orders for this visit:  Ductal carcinoma in situ (DCIS) of right breast   The patient appears to have an 8 mm area of ductal carcinoma in situ in the upper portion of the right breast with favorable markers and clinically negative nodes. I have discussed with her the different options for treatment and at this point she seems to  favor breast conservation which I feel is very reasonable. She will not need a node evaluation. I have discussed with her in detail the risk and benefits of the operation as well as some of the technical aspects including use of a radioactive seed for localization and she understands. Her son would like [place] to arrange for a second opinion at [facility] before she will schedule surgery. She will also meet with medical and  radiation oncology to discuss adjuvant therapy. I will have everything ready and wait for her to notify us when she is ready to schedule  Griselda Miner, MD 12/23/2022  PRE-OPERATIVE DIAGNOSIS:  RIGHT BREAST DUCTAL CARCINOMA IN SITU   POST-OPERATIVE DIAGNOSIS:  RIGHT BREAST DUCTAL CARCINOMA IN SITU   PROCEDURE:  Procedure(s): RIGHT BREAST LUMPECTOMY WITH RADIOACTIVE SEED LOCALIZATION (Right)   SURGEON:  Surgeons and Role:    Griselda Miner, MD - Primary  Past/Anticipated interventions by medical oncology, if any:  Serena Croissant, MD 10/30/2022  CHIEF COMPLAINTS/PURPOSE OF CONSULTATION:  Newly diagnosed right breast DCIS   HISTORY OF PRESENTING ILLNESS:  Taylor House 67 y.o. female is here because of recent diagnosis of right breast DCIS.  Patient had routine screening mammogram that detected a conglomeration of 3 masses in the right breast 2 of which were cystic and stable finding but a third lesion was 0.8 cm at 12 o'clock position which on biopsy came back as low-grade DCIS that was ER/PR positive.  She was presented this morning in the multidisciplinary tumor board and she is here today accompanied by her family to discuss treatment plan.   I reviewed her records extensively and collaborated the history with the patient.  ASSESSMENT AND PLAN:  Ductal carcinoma in situ (DCIS) of right breast 10/21/2022:Screening mammogram detected conglomeration of 3 masses right breast upper outer quadrant: 0.8 cm at 10:00: Stable, 0.6 cm: Cyst, 0.8 cm at 12:00: Low-grade DCIS ER 100%, PR 100%   Pathology review: I discussed with the patient the difference between DCIS and invasive breast cancer. It is considered a precancerous lesion. DCIS is classified as a 0. It is generally detected through mammograms as calcifications. We discussed the significance of grades and its impact on prognosis. We also discussed the importance of ER and PR receptors and their implications to adjuvant treatment options.  Prognosis of DCIS dependence on grade, comedo necrosis. It is anticipated that if not treated, 20-30% of DCIS can develop into invasive breast cancer.   Recommendation: 1. Breast conserving surgery 2. Followed by adjuvant radiation therapy 3. Followed by antiestrogen therapy with tamoxifen 5 years   Tamoxifen counseling: We discussed the risks and benefits of tamoxifen. These include but not limited to insomnia, hot flashes, mood changes, vaginal dryness, and weight gain. Although rare, serious side effects including endometrial cancer, risk of blood clots were also discussed. We strongly believe that the benefits far outweigh the risks. Patient understands these risks and consented to starting treatment. Planned treatment duration is 5 years.   Return to clinic after surgery to discuss the final pathology report and come up with an adjuvant treatment plan.   SUMMARY OF ONCOLOGIC HISTORY:    Oncology History  Ductal carcinoma in situ (DCIS) of right breast  10/21/2022 Initial Diagnosis    Screening mammogram detected conglomeration of 3 masses right breast upper outer quadrant: 0.8 cm at 10:00: Stable, 0.6 cm: Cyst, 0.8 cm at 12:00: Low-grade DCIS ER 100%, PR 100%    10/30/2022 Cancer Staging    Staging  form: Breast, AJCC 8th Edition - Clinical: Stage 0 (cTis (DCIS), cN0, cM0, G1, ER+, PR+, HER2: Not Assessed) - Signed by Serena Croissant, MD on 10/30/2022 Histologic grading system: 3 grade system    Lymphedema issues, if any: none to report  Pain issues, if any:  none at this time  SAFETY ISSUES: Prior radiation? no Pacemaker/ICD? no Possible current pregnancy?no Is the patient on methotrexate? no  Current Complaints / other details: Wants to know what to expect and the plan overall.     Vitals:   01/20/23 0739  BP: (!) 167/81  Pulse: 68  Resp: 18  Temp: (!) 96.9 F (36.1 C)  SpO2: 100%

## 2023-01-06 NOTE — Assessment & Plan Note (Deleted)
12/23/2022:Right lumpectomy: Grade 2 DCIS 9 mm, margins negative, ER 100%, PR 100% Pathology counseling: I discussed the final pathology report of the patient provided  a copy of this report. I discussed the margins.  We also discussed the final staging along with previously performed ER/PR testing.  Treatment plan: adjuvant radiation therapy Followed by adjuvant antiestrogen therapy with tamoxifen x 5 years  Return to clinic after radiation is complete

## 2023-01-07 ENCOUNTER — Telehealth: Payer: Self-pay | Admitting: Hematology and Oncology

## 2023-01-12 ENCOUNTER — Encounter: Payer: Self-pay | Admitting: Hematology and Oncology

## 2023-01-17 ENCOUNTER — Telehealth: Payer: Self-pay

## 2023-01-17 NOTE — Telephone Encounter (Signed)
Rn called pt in an attempt to get pre consult information. Pt was busy at this afternoon and nursing staff will obtain this information in clinic.

## 2023-01-19 NOTE — Progress Notes (Signed)
Radiation Oncology         [phone]) [phone] ________________________________  Name: Taylor House MRN: [id number]  Date: 01/20/2023  DOB: 16-Apr-1956  Follow-Up Visit Note  Outpatient  CC: Pcp, No  Serena Croissant, MD  Diagnosis:   No diagnosis found.   Stage 0 (cTis (DCIS), cN0, cM0) Right Breast, Intermediate grade DCIS, ER+ / PR+ / Her2 not assessed: s/p lumpectomy   CHIEF COMPLAINT: Here to discuss management of right breast DCIS  Narrative:  The patient returns today for follow-up.     On her breast clinic consultation date of 10/30/22, she underwent genetic testing. Results showed no clinically significant variants detected by [facility] genetic testing.   Since her consultation date, she opted to proceed with a right breast lumpectomy without nodal biopsies on 12/23/22 under the care of Dr. Carolynne Edouard. Pathology from the procedure revealed: tumor size of 9 mm; histology of intermediate grade DCIS with focal necrosis; all margins negative for DCIS; margin status to in situ disease of 10 mm from the superior margin; no lymph nodes were examined;  ER status: 100% positive and PR status 100% positive, both with strong staining intensity, Her2 status not assessed.  Based on her discussion with Dr. [name] during her breast clinic consultation, she has opted to proceed with antiestrogen therapy consisting of tamoxifen x 5 years.   Symptomatically, the patient reports: ***        ALLERGIES:  is allergic to penicillins.  Meds: Current Outpatient Medications  Medication Sig Dispense Refill   acetaminophen (TYLENOL) 500 MG tablet Take 1,000-1,500 mg by mouth every 8 (eight) hours as needed for mild pain or headache.     atorvastatin (LIPITOR) 20 MG tablet Take 20 mg by mouth daily.     carvedilol (COREG) 6.25 MG tablet Take 6.25 mg by mouth daily.     Cholecalciferol (VITAMIN D3) 125 MCG (5000 UT) CHEW Chew 5,000 mcg by mouth every other day.     diclofenac Sodium (VOLTAREN) 1 % GEL Apply 1  Application topically at bedtime.     Efinaconazole (JUBLIA) 10 % SOLN Apply 10 % topically daily. Applied to toe     levothyroxine (SYNTHROID) 50 MCG tablet Take 50 mcg by mouth daily before breakfast.     oxyCODONE (ROXICODONE) 5 MG immediate release tablet Take 1 tablet (5 mg total) by mouth every 6 (six) hours as needed for severe pain. 10 tablet 0   No current facility-administered medications for this encounter.    Physical Findings:  vitals were not taken for this visit. .     General: Alert and oriented, in no acute distress HEENT: Head is normocephalic. Extraocular movements are intact. Oropharynx is clear. Neck: Neck is supple, no palpable cervical or supraclavicular lymphadenopathy. Heart: Regular in rate and rhythm with no murmurs, rubs, or gallops. Chest: Clear to auscultation bilaterally, with no rhonchi, wheezes, or rales. Abdomen: Soft, nontender, nondistended, with no rigidity or guarding. Extremities: No cyanosis or edema. Lymphatics: see Neck Exam Musculoskeletal: symmetric strength and muscle tone throughout. Neurologic: No obvious focalities. Speech is fluent.  Psychiatric: Judgment and insight are intact. Affect is appropriate. Breast exam reveals ***  Lab Findings: Lab Results  Component Value Date   WBC 5.8 11/25/2022   HGB 12.8 11/25/2022   HCT 40.7 11/25/2022   MCV 86.8 11/25/2022   PLT 262 11/25/2022    @LASTCHEMISTRY @  Radiographic Findings: No results found.  Impression/Plan: We discussed adjuvant radiotherapy today.  I recommend *** in order to ***.  I  reviewed the logistics, benefits, risks, and potential side effects of this treatment in detail. Risks may include but not necessary be limited to acute and late injury tissue in the radiation fields such as skin irritation (change in color/pigmentation, itching, dryness, pain, peeling). She may experience fatigue. We also discussed possible risk of long term cosmetic changes or scar tissue. There  is also a smaller risk for lung toxicity, ***cardiac toxicity, ***brachial plexopathy, ***lymphedema, ***musculoskeletal changes, ***rib fragility or ***induction of a second malignancy, ***late chronic non-healing soft tissue wound.    The patient asked good questions which I answered to her satisfaction. She is enthusiastic about proceeding with treatment. A consent form has been *** signed and placed in her chart.  A total of *** medically necessary complex treatment devices will be fabricated and supervised by me: *** fields with MLCs for custom blocks to protect heart, and lungs;  and, a Vac-lok. MORE COMPLEX DEVICES MAY BE MADE IN DOSIMETRY FOR FIELD IN FIELD BEAMS FOR DOSE HOMOGENEITY.  I have requested : 3D Simulation which is medically necessary to give adequate dose to at risk tissues while sparing lungs and heart.  I have requested a DVH of the following structures: lungs, heart, *** lumpectomy cavity.    The patient will receive *** Gy in *** fractions to the *** with *** fields.  This will be *** followed by a boost.  On date of service, in total, I spent *** minutes on this encounter. Patient was seen in person.  _____________________________________   Lonie Peak, MD  This document serves as a record of services personally performed by Lonie Peak, MD. It was created on her behalf by Neena Rhymes, a trained medical scribe. The creation of this record is based on the scribe's personal observations and the provider's statements to them. This document has been checked and approved by the attending provider.

## 2023-01-20 ENCOUNTER — Other Ambulatory Visit: Payer: Self-pay

## 2023-01-20 ENCOUNTER — Encounter: Payer: Self-pay | Admitting: Radiation Oncology

## 2023-01-20 ENCOUNTER — Ambulatory Visit
Admission: RE | Admit: 2023-01-20 | Discharge: 2023-01-20 | Disposition: A | Discharge status: Home / Self Care | Payer: Medicare HMO | Source: Ambulatory Visit | Attending: Radiation Oncology | Admitting: Radiation Oncology

## 2023-01-20 VITALS — BP 167/81 | HR 68 | Temp 96.9°F | Resp 18 | Ht 64.0 in | Wt 229.2 lb

## 2023-01-20 DIAGNOSIS — Z79899 Other long term (current) drug therapy: Secondary | ICD-10-CM | POA: Diagnosis not present

## 2023-01-20 DIAGNOSIS — Z79890 Hormone replacement therapy (postmenopausal): Secondary | ICD-10-CM | POA: Insufficient documentation

## 2023-01-20 DIAGNOSIS — D0511 Intraductal carcinoma in situ of right breast: Secondary | ICD-10-CM | POA: Insufficient documentation

## 2023-01-20 DIAGNOSIS — Z923 Personal history of irradiation: Secondary | ICD-10-CM | POA: Insufficient documentation

## 2023-01-20 DIAGNOSIS — Z510 Encounter for antineoplastic radiation therapy: Secondary | ICD-10-CM | POA: Diagnosis present

## 2023-01-21 ENCOUNTER — Telehealth: Payer: Self-pay

## 2023-01-21 ENCOUNTER — Inpatient Hospital Stay: Payer: Medicare HMO | Attending: Hematology and Oncology | Admitting: Hematology and Oncology

## 2023-01-21 VITALS — BP 162/88 | HR 78 | Temp 97.9°F | Resp 18 | Ht 64.0 in | Wt 229.5 lb

## 2023-01-21 DIAGNOSIS — Z79899 Other long term (current) drug therapy: Secondary | ICD-10-CM | POA: Diagnosis not present

## 2023-01-21 DIAGNOSIS — Z79890 Hormone replacement therapy (postmenopausal): Secondary | ICD-10-CM | POA: Insufficient documentation

## 2023-01-21 DIAGNOSIS — Z880 Allergy status to penicillin: Secondary | ICD-10-CM | POA: Insufficient documentation

## 2023-01-21 DIAGNOSIS — N6001 Solitary cyst of right breast: Secondary | ICD-10-CM | POA: Diagnosis not present

## 2023-01-21 DIAGNOSIS — D0511 Intraductal carcinoma in situ of right breast: Secondary | ICD-10-CM | POA: Diagnosis present

## 2023-01-21 MED ORDER — TAMOXIFEN CITRATE 10 MG PO TABS: 5.0000 mg | ORAL_TABLET | Freq: Every day | ORAL | 1 refills | Status: AC

## 2023-01-21 NOTE — Assessment & Plan Note (Signed)
12/23/2022:Right lumpectomy: Grade 2 DCIS 9 mm, margins negative, ER 100%, PR 100% Pathology counseling: I discussed the final pathology report of the patient provided  a copy of this report. I discussed the margins.  We also discussed the final staging along with previously performed ER/PR testing.  Treatment plan: adjuvant radiation therapy 02/04/2023-02/28/23 2. Followed by antiestrogen therapy with tamoxifen 5 years  Return to clinic in 4 months for survivorship care plan visit

## 2023-01-21 NOTE — Progress Notes (Signed)
Patient Care Team: Pcp, No as PCP - General Taylor Miner, MD as Consulting Physician (General Surgery) Serena Croissant, MD as Consulting Physician (Hematology and Oncology) Lonie Peak, MD as Attending Physician (Radiation Oncology) Donnelly Angelica, RN as Oncology Nurse Navigator Pershing Proud, RN as Oncology Nurse Navigator  DIAGNOSIS:  Encounter Diagnosis  Name Primary?   Ductal carcinoma in situ (DCIS) of right breast Yes    SUMMARY OF ONCOLOGIC HISTORY: Oncology History  Ductal carcinoma in situ (DCIS) of right breast  10/21/2022 Initial Diagnosis   Screening mammogram detected conglomeration of 3 masses right breast upper outer quadrant: 0.8 cm at 10:00: Stable, 0.6 cm: Cyst, 0.8 cm at 12:00: Low-grade DCIS ER 100%, PR 100%   10/30/2022 Cancer Staging   Staging form: Breast, AJCC 8th Edition - Clinical: Stage 0 (cTis (DCIS), cN0, cM0, G1, ER+, PR+, HER2: Not Assessed) - Signed by Serena Croissant, MD on 10/30/2022 Histologic grading system: 3 grade system   11/12/2022 Genetic Testing   Negative [facility] Common Hereditary Cancers +RNA Panel.  Report date is 11/12/2022.   The [facility] Common Hereditary Cancers + RNA Panel includes sequencing, deletion/duplication, and RNA analysis of the following 48 genes: APC, ATM, AXIN2, BAP1, BARD1, BMPR1A, BRCA1, BRCA2, BRIP1, CDH1, CDK4*, CDKN2A*, CHEK2, CTNNA1, DICER1, EPCAM* (del/dup only), FH, GREM1* (promoter dup analysis only), HOXB13*, KIT*, MBD4*, MEN1, MLH1, MSH2, MSH3, MSH6, MUTYH, NF1, NTHL1, PALB2, PDGFRA*, PMS2, POLD1, POLE, PTEN, RAD51C, RAD51D, SDHA (sequencing only), SDHB, SDHC, SDHD, SMAD4, SMARCA4, STK11, TP53, TSC1, TSC2, VHL.  *Genes without RNA analysis.    12/23/2022 Surgery   Right lumpectomy: Grade 2 DCIS 9 mm, margins negative, ER 100%, PR 100%     CHIEF COMPLIANT: Follow-up after surgery  INTERVAL HISTORY: Taylor House is a 67 y.o. female is here because of recent diagnosis of right breast DCIS.   Patient denies  any pain. States surgery and recovery went well. She had no scars. She has no complaints or side effects to report to the clinic.  ALLERGIES:  is allergic to penicillins.  MEDICATIONS:  Current Outpatient Medications  Medication Sig Dispense Refill   acetaminophen (TYLENOL) 500 MG tablet Take 1,000-1,500 mg by mouth every 8 (eight) hours as needed for mild pain or headache.     atorvastatin (LIPITOR) 20 MG tablet Take 20 mg by mouth daily.     carvedilol (COREG) 6.25 MG tablet Take 6.25 mg by mouth daily.     Cholecalciferol (VITAMIN D3) 125 MCG (5000 UT) CHEW Chew 5,000 mcg by mouth every other day.     diclofenac Sodium (VOLTAREN) 1 % GEL Apply 1 Application topically at bedtime.     levothyroxine (SYNTHROID) 50 MCG tablet Take 50 mcg by mouth daily before breakfast.     [START ON 02/28/2023] tamoxifen (NOLVADEX) 10 MG tablet Take 0.5 tablets (5 mg total) by mouth daily. 90 tablet 1   No current facility-administered medications for this visit.    PHYSICAL EXAMINATION: ECOG PERFORMANCE STATUS: 1 - Symptomatic but completely ambulatory  Vitals:   01/21/23 1051  BP: (!) 162/88  Pulse: 78  Resp: 18  Temp: 97.9 F (36.6 C)  SpO2: 100%   Filed Weights   01/21/23 1051  Weight: 229 lb 8 oz (104.1 kg)      LABORATORY DATA:  I have reviewed the data as listed    Latest Ref Rng & Units 11/25/2022   10:57 AM 10/30/2022   12:37 PM 06/11/2021    3:50 PM  CMP  Glucose  70 - 99 mg/dL 87  829  562   BUN 8 - 23 mg/dL 8  9  12    Creatinine 0.44 - 1.00 mg/dL 1.30  8.65  7.84   Sodium 135 - 145 mmol/L 140  140  138   Potassium 3.5 - 5.1 mmol/L 3.8  4.2  3.6   Chloride 98 - 111 mmol/L 108  106  106   CO2 22 - 32 mmol/L 24  29  26    Calcium 8.9 - 10.3 mg/dL 9.1  9.5  8.7   Total Protein 6.5 - 8.1 g/dL  7.3    Total Bilirubin 0.3 - 1.2 mg/dL  0.6    Alkaline Phos 38 - 126 U/L  79    AST 15 - 41 U/L  14    ALT 0 - 44 U/L  16      Lab Results  Component Value Date   WBC 5.8  11/25/2022   HGB 12.8 11/25/2022   HCT 40.7 11/25/2022   MCV 86.8 11/25/2022   PLT 262 11/25/2022   NEUTROABS 4.1 10/30/2022    ASSESSMENT & PLAN:  Ductal carcinoma in situ (DCIS) of right breast 12/23/2022:Right lumpectomy: Grade 2 DCIS 9 mm, margins negative, ER 100%, PR 100% Pathology counseling: I discussed the final pathology report of the patient provided  a copy of this report. I discussed the margins.  We also discussed the final staging along with previously performed ER/PR testing.  Treatment plan: adjuvant radiation therapy 02/04/2023-02/28/23 2. Followed by antiestrogen therapy with tamoxifen 5 years  Tamoxifen counseling: Discussed the 5 mg tamoxifen dosage we also discussed the risks and benefits of tamoxifen.  She will start this after radiation is complete.  Return to clinic in 3 months for survivorship care plan visit   No orders of the defined types were placed in this encounter.  The patient has a good understanding of the overall plan. she agrees with it. she will call with any problems that may develop before the next visit here. Total time spent: 30 mins including face to face time and time spent for planning, charting and co-ordination of care   [name], MD 01/21/23    I Janan Ridge am acting as a Neurosurgeon for [employer]  I have reviewed the above documentation for accuracy and completeness, and I agree with the above.

## 2023-01-21 NOTE — Telephone Encounter (Signed)
     [facility] Clinical Social Work  Clinical Social Work was referred by nurse for assessment of psychosocial needs.  Clinical Social Worker contacted patient by phone to offer support and assess for needs.  CSW and patient set up an appointment for 9/12 for initial assessment.  Marguerita Merles, LCSWA  Clinical Social Worker [facility]

## 2023-01-23 ENCOUNTER — Inpatient Hospital Stay: Payer: Medicare HMO

## 2023-01-23 NOTE — Progress Notes (Signed)
[facility] CSW Progress Note  Clinical Child psychotherapist contacted patient by phone to discuss referral from medical team. Patient states primary need is assistance with paying for upcoming utility bill. CSW went through grant options available for patient [name], [name], and [employer]). Patient stated that [employer] was the most appropriate but will call CSW back after discussing bill with utility provider. CSW provided patient with direct number.  Marguerita Merles, LCSWA Clinical Social Worker [facility]

## 2023-01-27 ENCOUNTER — Encounter: Payer: Self-pay | Admitting: *Deleted

## 2023-01-27 DIAGNOSIS — Z510 Encounter for antineoplastic radiation therapy: Secondary | ICD-10-CM | POA: Diagnosis not present

## 2023-01-27 DIAGNOSIS — D0511 Intraductal carcinoma in situ of right breast: Secondary | ICD-10-CM

## 2023-01-30 ENCOUNTER — Telehealth: Payer: Self-pay

## 2023-01-30 NOTE — Telephone Encounter (Signed)
[facility] CSW Progress Note  Patient left vm requesting a call back to discuss financial assistance. CSW contacted patient. No answer, left vm with direct contact information.  Marguerita Merles, LCSWA Clinical Social Worker [facility]

## 2023-02-03 ENCOUNTER — Ambulatory Visit
Admission: RE | Admit: 2023-02-03 | Discharge: 2023-02-03 | Disposition: A | Discharge status: Home / Self Care | Payer: Medicare HMO | Source: Ambulatory Visit | Attending: Radiation Oncology | Admitting: Radiation Oncology

## 2023-02-03 ENCOUNTER — Other Ambulatory Visit: Payer: Self-pay

## 2023-02-03 ENCOUNTER — Ambulatory Visit: Payer: Medicare HMO

## 2023-02-03 DIAGNOSIS — D0511 Intraductal carcinoma in situ of right breast: Secondary | ICD-10-CM

## 2023-02-03 DIAGNOSIS — Z510 Encounter for antineoplastic radiation therapy: Secondary | ICD-10-CM | POA: Diagnosis not present

## 2023-02-03 LAB — RAD ONC ARIA SESSION SUMMARY
Course Elapsed Days: 0
Plan Fractions Treated to Date: 1
Plan Prescribed Dose Per Fraction: 2.67 Gy
Plan Total Fractions Prescribed: 15
Plan Total Prescribed Dose: 40.05 Gy
Reference Point Dosage Given to Date: 2.67 Gy
Reference Point Session Dosage Given: 2.67 Gy
Session Number: 1

## 2023-02-03 MED ORDER — RADIAPLEXRX EX GEL
Freq: Once | CUTANEOUS | Status: AC
  Administered 2023-02-03: 1 via TOPICAL

## 2023-02-04 ENCOUNTER — Other Ambulatory Visit: Payer: Self-pay

## 2023-02-04 ENCOUNTER — Ambulatory Visit
Admission: RE | Admit: 2023-02-04 | Discharge: 2023-02-04 | Disposition: A | Discharge status: Home / Self Care | Payer: Medicare HMO | Source: Ambulatory Visit | Attending: Radiation Oncology

## 2023-02-04 DIAGNOSIS — Z510 Encounter for antineoplastic radiation therapy: Secondary | ICD-10-CM | POA: Diagnosis not present

## 2023-02-04 LAB — RAD ONC ARIA SESSION SUMMARY
Course Elapsed Days: 1
Plan Fractions Treated to Date: 2
Plan Prescribed Dose Per Fraction: 2.67 Gy
Plan Total Fractions Prescribed: 15
Plan Total Prescribed Dose: 40.05 Gy
Reference Point Dosage Given to Date: 5.34 Gy
Reference Point Session Dosage Given: 2.67 Gy
Session Number: 2

## 2023-02-05 ENCOUNTER — Ambulatory Visit: Payer: Medicare HMO

## 2023-02-06 ENCOUNTER — Ambulatory Visit
Admission: RE | Admit: 2023-02-06 | Discharge: 2023-02-06 | Disposition: A | Discharge status: Home / Self Care | Payer: Medicare HMO | Source: Ambulatory Visit | Attending: Radiation Oncology | Admitting: Radiation Oncology

## 2023-02-06 ENCOUNTER — Other Ambulatory Visit: Payer: Self-pay

## 2023-02-06 DIAGNOSIS — Z510 Encounter for antineoplastic radiation therapy: Secondary | ICD-10-CM | POA: Diagnosis not present

## 2023-02-06 LAB — RAD ONC ARIA SESSION SUMMARY
Course Elapsed Days: 3
Plan Fractions Treated to Date: 3
Plan Prescribed Dose Per Fraction: 2.67 Gy
Plan Total Fractions Prescribed: 15
Plan Total Prescribed Dose: 40.05 Gy
Reference Point Dosage Given to Date: 8.01 Gy
Reference Point Session Dosage Given: 2.67 Gy
Session Number: 3

## 2023-02-07 ENCOUNTER — Ambulatory Visit
Admission: RE | Admit: 2023-02-07 | Discharge: 2023-02-07 | Disposition: A | Discharge status: Home / Self Care | Payer: Medicare HMO | Source: Ambulatory Visit | Attending: Radiation Oncology | Admitting: Radiation Oncology

## 2023-02-07 ENCOUNTER — Other Ambulatory Visit: Payer: Self-pay

## 2023-02-07 ENCOUNTER — Inpatient Hospital Stay: Payer: Medicare HMO

## 2023-02-07 DIAGNOSIS — Z510 Encounter for antineoplastic radiation therapy: Secondary | ICD-10-CM | POA: Diagnosis not present

## 2023-02-07 LAB — RAD ONC ARIA SESSION SUMMARY
Course Elapsed Days: 4
Plan Fractions Treated to Date: 4
Plan Prescribed Dose Per Fraction: 2.67 Gy
Plan Total Fractions Prescribed: 15
Plan Total Prescribed Dose: 40.05 Gy
Reference Point Dosage Given to Date: 10.68 Gy
Reference Point Session Dosage Given: 2.67 Gy
Session Number: 4

## 2023-02-07 NOTE — Progress Notes (Signed)
[facility] CSW Progress Note  Clinical Social Worker contacted patient to follow up on need for financial assistance. Patient expressed concerns about medical treatment cost. CSW and patient discussed financial options available and scheduled an appointment to complete them together    Taylor House, [employer] Clinical Social Worker [facility]

## 2023-02-10 ENCOUNTER — Ambulatory Visit
Admission: RE | Admit: 2023-02-10 | Discharge: 2023-02-10 | Disposition: A | Discharge status: Home / Self Care | Payer: Medicare HMO | Source: Ambulatory Visit | Attending: Radiation Oncology

## 2023-02-10 ENCOUNTER — Telehealth: Payer: Self-pay | Admitting: Radiation Oncology

## 2023-02-10 ENCOUNTER — Other Ambulatory Visit: Payer: Self-pay

## 2023-02-10 DIAGNOSIS — Z510 Encounter for antineoplastic radiation therapy: Secondary | ICD-10-CM | POA: Diagnosis not present

## 2023-02-10 LAB — RAD ONC ARIA SESSION SUMMARY
Course Elapsed Days: 7
Plan Fractions Treated to Date: 5
Plan Prescribed Dose Per Fraction: 2.67 Gy
Plan Total Fractions Prescribed: 15
Plan Total Prescribed Dose: 40.05 Gy
Reference Point Dosage Given to Date: 13.35 Gy
Reference Point Session Dosage Given: 2.67 Gy
Session Number: 5

## 2023-02-10 NOTE — Telephone Encounter (Signed)
9/30 @ 11:55 am Patient called to let someone know she maybe at least 5 mins late for her treatment appointment for today due to looking for parking.  Called L4 machine spoke to [place] so they are aware.

## 2023-02-11 ENCOUNTER — Other Ambulatory Visit: Payer: Self-pay

## 2023-02-11 ENCOUNTER — Ambulatory Visit
Admission: RE | Admit: 2023-02-11 | Discharge: 2023-02-11 | Disposition: A | Discharge status: Home / Self Care | Payer: Medicare HMO | Source: Ambulatory Visit | Attending: Radiation Oncology | Admitting: Radiation Oncology

## 2023-02-11 DIAGNOSIS — Z510 Encounter for antineoplastic radiation therapy: Secondary | ICD-10-CM | POA: Insufficient documentation

## 2023-02-11 DIAGNOSIS — D0511 Intraductal carcinoma in situ of right breast: Secondary | ICD-10-CM | POA: Insufficient documentation

## 2023-02-11 LAB — RAD ONC ARIA SESSION SUMMARY
Course Elapsed Days: 8
Plan Fractions Treated to Date: 6
Plan Prescribed Dose Per Fraction: 2.67 Gy
Plan Total Fractions Prescribed: 15
Plan Total Prescribed Dose: 40.05 Gy
Reference Point Dosage Given to Date: 16.02 Gy
Reference Point Session Dosage Given: 2.67 Gy
Session Number: 6

## 2023-02-12 ENCOUNTER — Telehealth: Payer: Self-pay | Admitting: Radiation Oncology

## 2023-02-12 ENCOUNTER — Ambulatory Visit: Payer: Medicare HMO

## 2023-02-12 DIAGNOSIS — Z510 Encounter for antineoplastic radiation therapy: Secondary | ICD-10-CM | POA: Diagnosis not present

## 2023-02-12 NOTE — Telephone Encounter (Signed)
Pt called requesting to cx today's tx appt. Pt states she just got off work and is severely exhausted. Pt was transferred to RTT support to r/s appt.

## 2023-02-13 ENCOUNTER — Other Ambulatory Visit: Payer: Self-pay

## 2023-02-13 ENCOUNTER — Ambulatory Visit
Admission: RE | Admit: 2023-02-13 | Discharge: 2023-02-13 | Disposition: A | Discharge status: Home / Self Care | Payer: Medicare HMO | Source: Ambulatory Visit | Attending: Radiation Oncology | Admitting: Radiation Oncology

## 2023-02-13 DIAGNOSIS — Z510 Encounter for antineoplastic radiation therapy: Secondary | ICD-10-CM | POA: Diagnosis not present

## 2023-02-13 LAB — RAD ONC ARIA SESSION SUMMARY
Course Elapsed Days: 10
Plan Fractions Treated to Date: 7
Plan Prescribed Dose Per Fraction: 2.67 Gy
Plan Total Fractions Prescribed: 15
Plan Total Prescribed Dose: 40.05 Gy
Reference Point Dosage Given to Date: 18.69 Gy
Reference Point Session Dosage Given: 2.67 Gy
Session Number: 7

## 2023-02-14 ENCOUNTER — Other Ambulatory Visit: Payer: Self-pay

## 2023-02-14 ENCOUNTER — Ambulatory Visit
Admission: RE | Admit: 2023-02-14 | Discharge: 2023-02-14 | Disposition: A | Discharge status: Home / Self Care | Payer: Medicare HMO | Source: Ambulatory Visit | Attending: Radiation Oncology | Admitting: Radiation Oncology

## 2023-02-14 DIAGNOSIS — Z510 Encounter for antineoplastic radiation therapy: Secondary | ICD-10-CM | POA: Diagnosis not present

## 2023-02-14 LAB — RAD ONC ARIA SESSION SUMMARY
Course Elapsed Days: 11
Plan Fractions Treated to Date: 8
Plan Prescribed Dose Per Fraction: 2.67 Gy
Plan Total Fractions Prescribed: 15
Plan Total Prescribed Dose: 40.05 Gy
Reference Point Dosage Given to Date: 21.36 Gy
Reference Point Session Dosage Given: 2.67 Gy
Session Number: 8

## 2023-02-17 ENCOUNTER — Ambulatory Visit
Admission: RE | Admit: 2023-02-17 | Discharge: 2023-02-17 | Disposition: A | Discharge status: Home / Self Care | Payer: Medicare HMO | Source: Ambulatory Visit | Attending: Radiation Oncology

## 2023-02-17 ENCOUNTER — Other Ambulatory Visit: Payer: Self-pay

## 2023-02-17 ENCOUNTER — Ambulatory Visit: Payer: Medicare HMO | Admitting: Radiation Oncology

## 2023-02-17 DIAGNOSIS — Z510 Encounter for antineoplastic radiation therapy: Secondary | ICD-10-CM | POA: Diagnosis not present

## 2023-02-17 LAB — RAD ONC ARIA SESSION SUMMARY
Course Elapsed Days: 14
Plan Fractions Treated to Date: 9
Plan Prescribed Dose Per Fraction: 2.67 Gy
Plan Total Fractions Prescribed: 15
Plan Total Prescribed Dose: 40.05 Gy
Reference Point Dosage Given to Date: 24.03 Gy
Reference Point Session Dosage Given: 2.67 Gy
Session Number: 9

## 2023-02-18 ENCOUNTER — Inpatient Hospital Stay: Payer: Medicare HMO | Attending: Hematology and Oncology

## 2023-02-18 ENCOUNTER — Other Ambulatory Visit: Payer: Self-pay

## 2023-02-18 ENCOUNTER — Ambulatory Visit
Admission: RE | Admit: 2023-02-18 | Discharge: 2023-02-18 | Disposition: A | Discharge status: Home / Self Care | Payer: Medicare HMO | Source: Ambulatory Visit | Attending: Radiation Oncology

## 2023-02-18 DIAGNOSIS — Z510 Encounter for antineoplastic radiation therapy: Secondary | ICD-10-CM | POA: Diagnosis not present

## 2023-02-18 LAB — RAD ONC ARIA SESSION SUMMARY
Course Elapsed Days: 15
Plan Fractions Treated to Date: 10
Plan Prescribed Dose Per Fraction: 2.67 Gy
Plan Total Fractions Prescribed: 15
Plan Total Prescribed Dose: 40.05 Gy
Reference Point Dosage Given to Date: 26.7 Gy
Reference Point Session Dosage Given: 2.67 Gy
Session Number: 10

## 2023-02-18 NOTE — Progress Notes (Signed)
[facility] CSW Progress Note  Visual merchandiser met with patient in person to complete financial assistance applications. CSW and Patient submitted applications to [employer] and [employer]. Patient and CSW completed the [employer] in [employer], not submitted on this date due to documentation that Patient will need to retrieve.   Marguerita Merles, LCSWA Clinical Social Worker [facility]

## 2023-02-19 ENCOUNTER — Other Ambulatory Visit: Payer: Self-pay

## 2023-02-19 ENCOUNTER — Ambulatory Visit
Admission: RE | Admit: 2023-02-19 | Discharge: 2023-02-19 | Disposition: A | Discharge status: Home / Self Care | Payer: Medicare HMO | Source: Ambulatory Visit | Attending: Radiation Oncology

## 2023-02-19 DIAGNOSIS — Z510 Encounter for antineoplastic radiation therapy: Secondary | ICD-10-CM | POA: Diagnosis not present

## 2023-02-19 LAB — RAD ONC ARIA SESSION SUMMARY
Course Elapsed Days: 16
Plan Fractions Treated to Date: 11
Plan Prescribed Dose Per Fraction: 2.67 Gy
Plan Total Fractions Prescribed: 15
Plan Total Prescribed Dose: 40.05 Gy
Reference Point Dosage Given to Date: 29.37 Gy
Reference Point Session Dosage Given: 2.67 Gy
Session Number: 11

## 2023-02-20 ENCOUNTER — Other Ambulatory Visit: Payer: Self-pay

## 2023-02-20 ENCOUNTER — Ambulatory Visit
Admission: RE | Admit: 2023-02-20 | Discharge: 2023-02-20 | Disposition: A | Discharge status: Home / Self Care | Payer: Medicare HMO | Source: Ambulatory Visit | Attending: Radiation Oncology | Admitting: Radiation Oncology

## 2023-02-20 DIAGNOSIS — Z510 Encounter for antineoplastic radiation therapy: Secondary | ICD-10-CM | POA: Diagnosis not present

## 2023-02-20 LAB — RAD ONC ARIA SESSION SUMMARY
Course Elapsed Days: 17
Plan Fractions Treated to Date: 12
Plan Prescribed Dose Per Fraction: 2.67 Gy
Plan Total Fractions Prescribed: 15
Plan Total Prescribed Dose: 40.05 Gy
Reference Point Dosage Given to Date: 32.04 Gy
Reference Point Session Dosage Given: 2.67 Gy
Session Number: 12

## 2023-02-21 ENCOUNTER — Other Ambulatory Visit: Payer: Self-pay

## 2023-02-21 ENCOUNTER — Ambulatory Visit
Admission: RE | Admit: 2023-02-21 | Discharge: 2023-02-21 | Disposition: A | Discharge status: Home / Self Care | Payer: Medicare HMO | Source: Ambulatory Visit | Attending: Radiation Oncology | Admitting: Radiation Oncology

## 2023-02-21 DIAGNOSIS — Z510 Encounter for antineoplastic radiation therapy: Secondary | ICD-10-CM | POA: Diagnosis not present

## 2023-02-21 LAB — RAD ONC ARIA SESSION SUMMARY
Course Elapsed Days: 18
Plan Fractions Treated to Date: 13
Plan Prescribed Dose Per Fraction: 2.67 Gy
Plan Total Fractions Prescribed: 15
Plan Total Prescribed Dose: 40.05 Gy
Reference Point Dosage Given to Date: 34.71 Gy
Reference Point Session Dosage Given: 2.67 Gy
Session Number: 13

## 2023-02-24 ENCOUNTER — Other Ambulatory Visit: Payer: Self-pay

## 2023-02-24 ENCOUNTER — Ambulatory Visit
Admission: RE | Admit: 2023-02-24 | Discharge: 2023-02-24 | Disposition: A | Discharge status: Home / Self Care | Payer: Medicare HMO | Source: Ambulatory Visit | Attending: Radiation Oncology | Admitting: Radiation Oncology

## 2023-02-24 ENCOUNTER — Ambulatory Visit: Payer: Medicare HMO

## 2023-02-24 DIAGNOSIS — Z510 Encounter for antineoplastic radiation therapy: Secondary | ICD-10-CM | POA: Diagnosis not present

## 2023-02-24 LAB — RAD ONC ARIA SESSION SUMMARY
Course Elapsed Days: 21
Plan Fractions Treated to Date: 14
Plan Prescribed Dose Per Fraction: 2.67 Gy
Plan Total Fractions Prescribed: 15
Plan Total Prescribed Dose: 40.05 Gy
Reference Point Dosage Given to Date: 37.38 Gy
Reference Point Session Dosage Given: 2.67 Gy
Session Number: 14

## 2023-02-25 ENCOUNTER — Ambulatory Visit: Payer: Medicare HMO

## 2023-02-25 ENCOUNTER — Ambulatory Visit
Admission: RE | Admit: 2023-02-25 | Discharge: 2023-02-25 | Discharge status: Home / Self Care | Payer: Medicare HMO | Source: Ambulatory Visit | Attending: Radiation Oncology

## 2023-02-25 ENCOUNTER — Other Ambulatory Visit: Payer: Self-pay

## 2023-02-25 DIAGNOSIS — Z510 Encounter for antineoplastic radiation therapy: Secondary | ICD-10-CM | POA: Diagnosis not present

## 2023-02-25 LAB — RAD ONC ARIA SESSION SUMMARY
Course Elapsed Days: 22
Plan Fractions Treated to Date: 15
Plan Prescribed Dose Per Fraction: 2.67 Gy
Plan Total Fractions Prescribed: 15
Plan Total Prescribed Dose: 40.05 Gy
Reference Point Dosage Given to Date: 40.05 Gy
Reference Point Session Dosage Given: 2.67 Gy
Session Number: 15

## 2023-02-26 ENCOUNTER — Ambulatory Visit: Payer: Medicare HMO

## 2023-02-26 ENCOUNTER — Other Ambulatory Visit: Payer: Self-pay

## 2023-02-26 ENCOUNTER — Ambulatory Visit
Admission: RE | Admit: 2023-02-26 | Discharge: 2023-02-26 | Disposition: A | Discharge status: Home / Self Care | Payer: Medicare HMO | Source: Ambulatory Visit | Attending: Radiation Oncology

## 2023-02-26 DIAGNOSIS — Z510 Encounter for antineoplastic radiation therapy: Secondary | ICD-10-CM | POA: Diagnosis not present

## 2023-02-26 LAB — RAD ONC ARIA SESSION SUMMARY
Course Elapsed Days: 23
Plan Fractions Treated to Date: 1
Plan Prescribed Dose Per Fraction: 2 Gy
Plan Total Fractions Prescribed: 5
Plan Total Prescribed Dose: 10 Gy
Reference Point Dosage Given to Date: 2 Gy
Reference Point Session Dosage Given: 2 Gy
Session Number: 16

## 2023-02-27 ENCOUNTER — Ambulatory Visit: Payer: Medicare HMO

## 2023-02-27 ENCOUNTER — Other Ambulatory Visit: Payer: Self-pay

## 2023-02-27 ENCOUNTER — Ambulatory Visit
Admission: RE | Admit: 2023-02-27 | Discharge: 2023-02-27 | Disposition: A | Discharge status: Home / Self Care | Payer: Medicare HMO | Source: Ambulatory Visit | Attending: Radiation Oncology | Admitting: Radiation Oncology

## 2023-02-27 DIAGNOSIS — Z510 Encounter for antineoplastic radiation therapy: Secondary | ICD-10-CM | POA: Diagnosis not present

## 2023-02-27 LAB — RAD ONC ARIA SESSION SUMMARY
Course Elapsed Days: 24
Plan Fractions Treated to Date: 2
Plan Prescribed Dose Per Fraction: 2 Gy
Plan Total Fractions Prescribed: 5
Plan Total Prescribed Dose: 10 Gy
Reference Point Dosage Given to Date: 4 Gy
Reference Point Session Dosage Given: 2 Gy
Session Number: 17

## 2023-02-28 ENCOUNTER — Ambulatory Visit
Admission: RE | Admit: 2023-02-28 | Discharge: 2023-02-28 | Disposition: A | Discharge status: Home / Self Care | Payer: Medicare HMO | Source: Ambulatory Visit | Attending: Radiation Oncology

## 2023-02-28 ENCOUNTER — Ambulatory Visit: Payer: Medicare HMO

## 2023-02-28 ENCOUNTER — Other Ambulatory Visit: Payer: Self-pay

## 2023-02-28 DIAGNOSIS — Z510 Encounter for antineoplastic radiation therapy: Secondary | ICD-10-CM | POA: Diagnosis not present

## 2023-02-28 LAB — RAD ONC ARIA SESSION SUMMARY
Course Elapsed Days: 25
Plan Fractions Treated to Date: 3
Plan Prescribed Dose Per Fraction: 2 Gy
Plan Total Fractions Prescribed: 5
Plan Total Prescribed Dose: 10 Gy
Reference Point Dosage Given to Date: 6 Gy
Reference Point Session Dosage Given: 2 Gy
Session Number: 18

## 2023-03-03 ENCOUNTER — Other Ambulatory Visit: Payer: Self-pay

## 2023-03-03 ENCOUNTER — Ambulatory Visit
Admission: RE | Admit: 2023-03-03 | Discharge: 2023-03-03 | Disposition: A | Discharge status: Home / Self Care | Payer: Medicare HMO | Source: Ambulatory Visit | Attending: Radiation Oncology

## 2023-03-03 ENCOUNTER — Ambulatory Visit: Payer: Medicare HMO

## 2023-03-03 DIAGNOSIS — Z510 Encounter for antineoplastic radiation therapy: Secondary | ICD-10-CM | POA: Diagnosis not present

## 2023-03-03 LAB — RAD ONC ARIA SESSION SUMMARY
Course Elapsed Days: 28
Plan Fractions Treated to Date: 4
Plan Prescribed Dose Per Fraction: 2 Gy
Plan Total Fractions Prescribed: 5
Plan Total Prescribed Dose: 10 Gy
Reference Point Dosage Given to Date: 8 Gy
Reference Point Session Dosage Given: 2 Gy
Session Number: 19

## 2023-03-04 ENCOUNTER — Ambulatory Visit
Admission: RE | Admit: 2023-03-04 | Discharge: 2023-03-04 | Disposition: A | Discharge status: Home / Self Care | Payer: Medicare HMO | Source: Ambulatory Visit | Attending: Radiation Oncology | Admitting: Radiation Oncology

## 2023-03-04 ENCOUNTER — Other Ambulatory Visit: Payer: Self-pay

## 2023-03-04 ENCOUNTER — Ambulatory Visit: Payer: Medicare HMO

## 2023-03-04 DIAGNOSIS — Z510 Encounter for antineoplastic radiation therapy: Secondary | ICD-10-CM | POA: Diagnosis not present

## 2023-03-04 LAB — RAD ONC ARIA SESSION SUMMARY
Course Elapsed Days: 29
Plan Fractions Treated to Date: 5
Plan Prescribed Dose Per Fraction: 2 Gy
Plan Total Fractions Prescribed: 5
Plan Total Prescribed Dose: 10 Gy
Reference Point Dosage Given to Date: 10 Gy
Reference Point Session Dosage Given: 2 Gy
Session Number: 20

## 2023-03-05 ENCOUNTER — Ambulatory Visit: Payer: Medicare HMO

## 2023-03-05 NOTE — Radiation Completion Notes (Signed)
Patient Name: Taylor House, Taylor House MRN: [id number] Date of Birth: 1955/06/13 Referring Physician: Griselda Miner, M.D. Date of Service: 2023-03-05 Radiation Oncologist: Lonie Peak, M.D. [facility]                             RADIATION ONCOLOGY END OF TREATMENT NOTE     Diagnosis: D05.11 Intraductal carcinoma in situ of right breast Staging on 2022-10-30: Ductal carcinoma in situ (DCIS) of right breast T=cTis (DCIS), N=cN0, M=cM0 Intent: Curative     ==========DELIVERED PLANS==========  First Treatment Date: 2023-02-03 - Last Treatment Date: 2023-03-04   Plan Name: Breast_R Site: Breast, Right Technique: 3D Mode: Photon Dose Per Fraction: 2.67 Gy Prescribed Dose (Delivered / Prescribed): 40.05 Gy / 40.05 Gy Prescribed Fxs (Delivered / Prescribed): 15 / 15   Plan Name: Breast_R_Bst Site: Breast, Right Technique: 3D Mode: Photon Dose Per Fraction: 2 Gy Prescribed Dose (Delivered / Prescribed): 10 Gy / 10 Gy Prescribed Fxs (Delivered / Prescribed): 5 / 5     ==========ON TREATMENT VISIT DATES========== 2023-02-03, 2023-02-10, 2023-02-17, 2023-02-24, 2023-03-03     ==========UPCOMING VISITS==========       ==========APPENDIX - ON TREATMENT VISIT NOTES==========   See weekly On Treatment Notes in Epic for details.

## 2023-03-15 IMAGING — CT CT HEAD W/O CM
3 series · 16 of 47 positions shown, 19 images · non-contrast
Comparison: None.

CLINICAL DATA: Dizziness and headache for 2 weeks



[Series 2: head wo · axial · 0.42mm/px · z∈[-173,-48]mm · 10 of 31 slices shown, 13 images]
[im 3/31  brain]
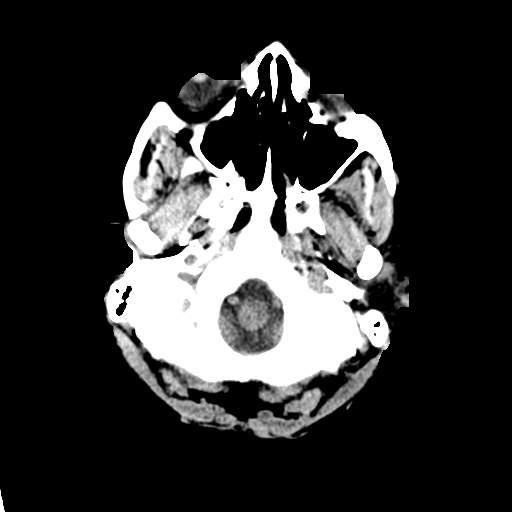
[im 3/31  bone]
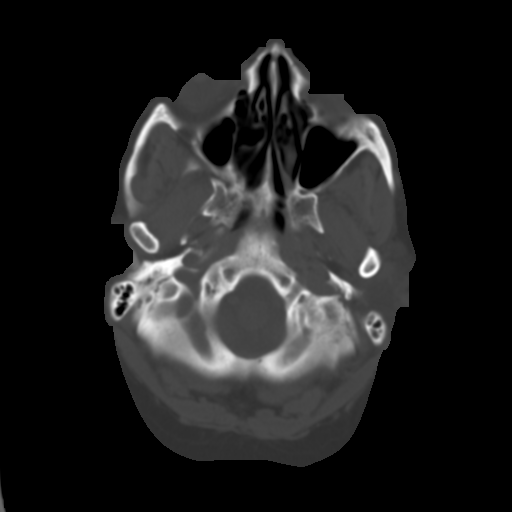
[im 6/31  brain]
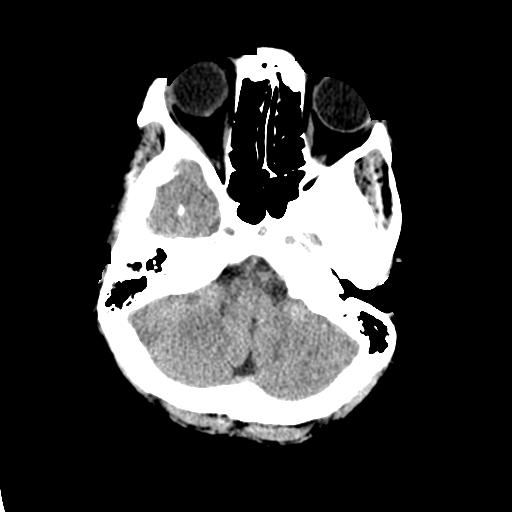
[im 9/31  brain]
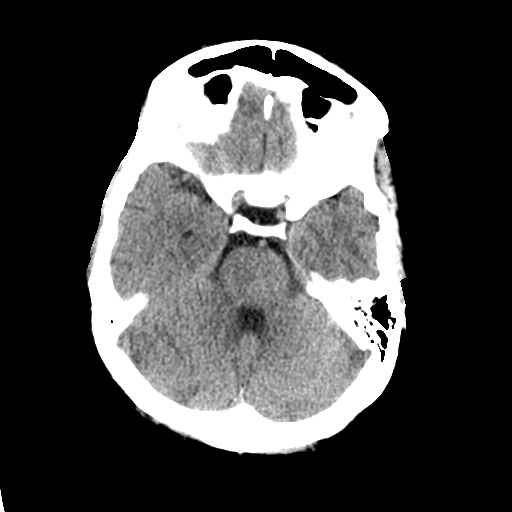
[im 11/31  brain]
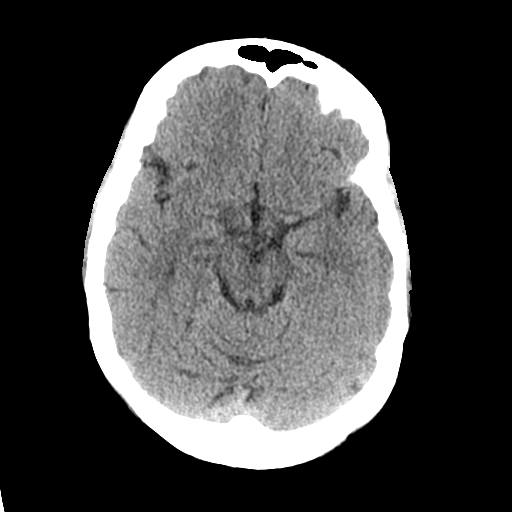
[im 14/31  brain]
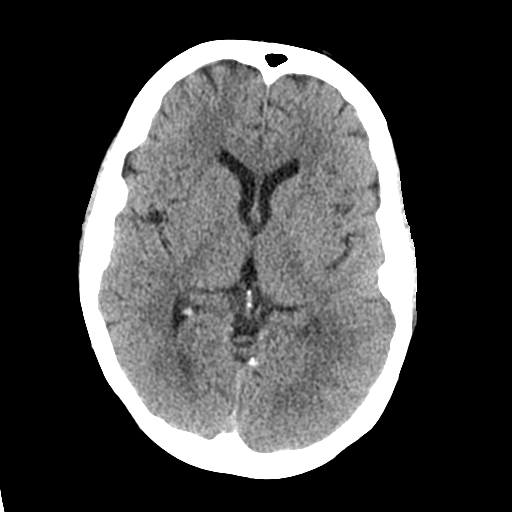
[im 14/31  bone]
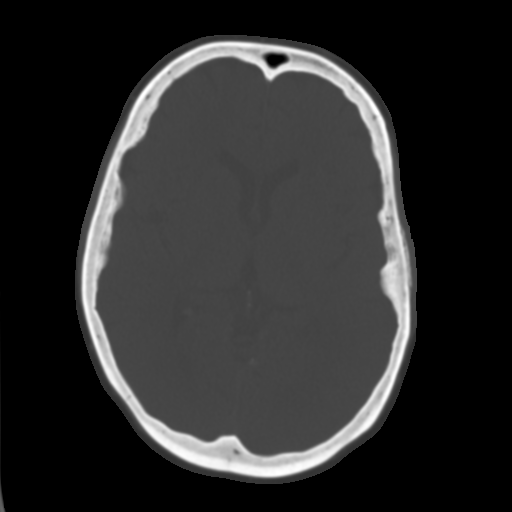
[im 17/31  brain]
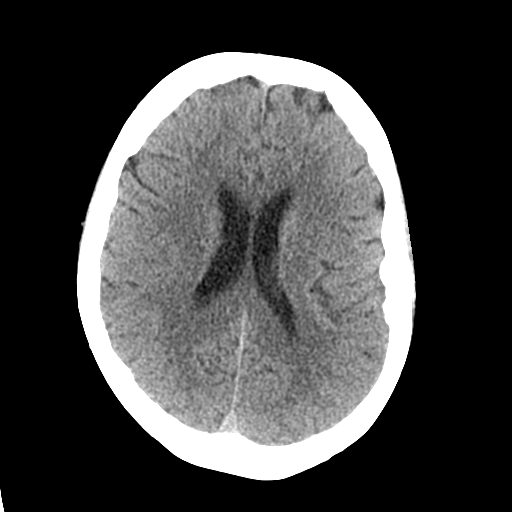
[im 20/31  brain]
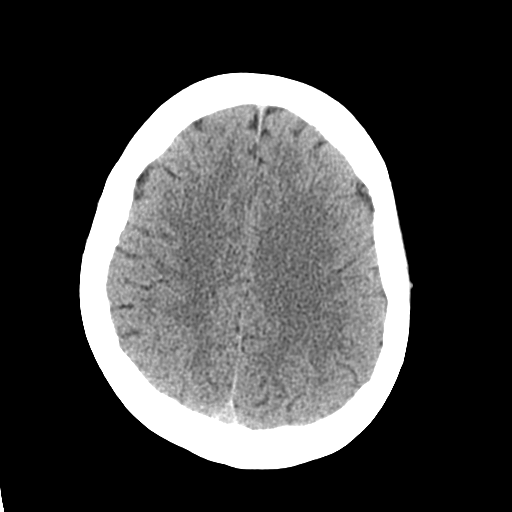
[im 23/31  brain]
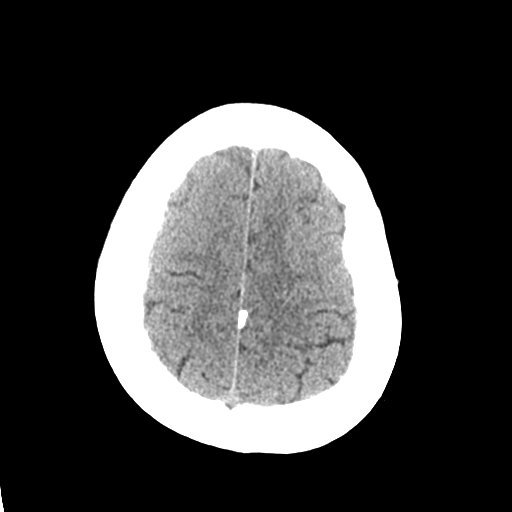
[im 25/31  brain]
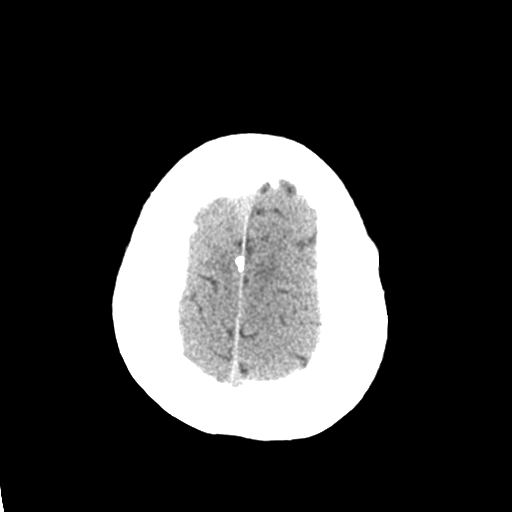
[im 25/31  bone]
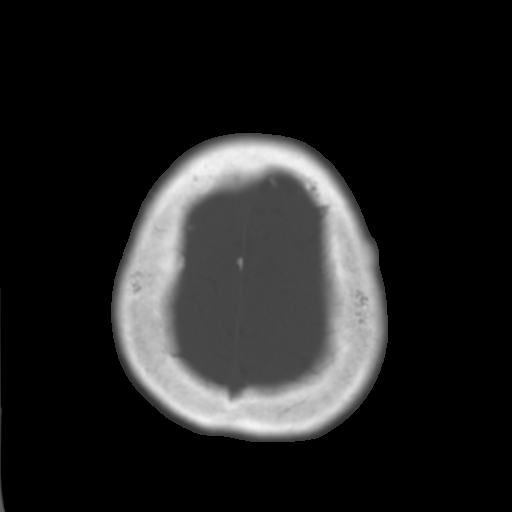
[im 28/31  brain]
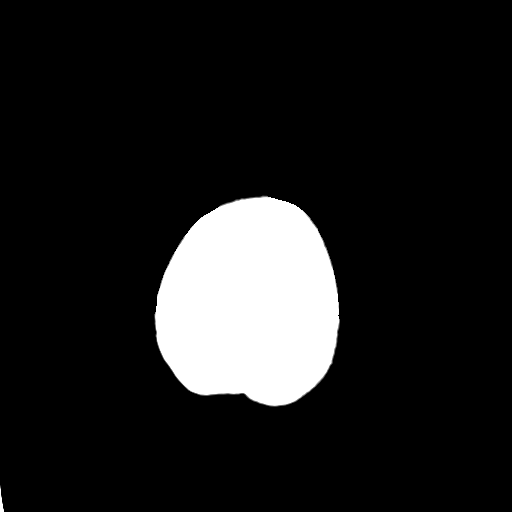

[Series 4: coronal soft tissue · coronal · 0.32mm/px · 3 of 68 slices shown]
[im 23/68  brain]
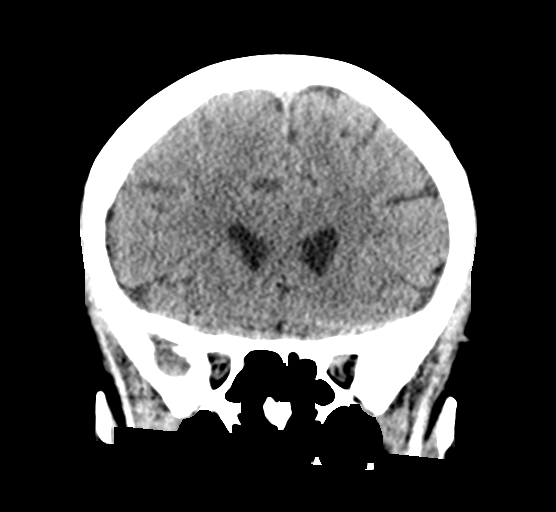
[im 30/68  brain]
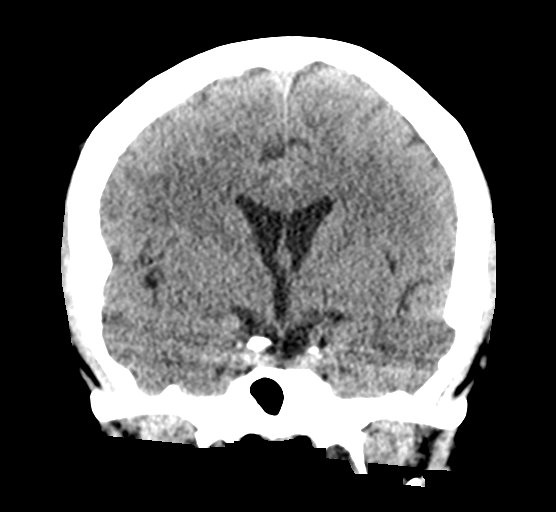
[im 38/68  brain]
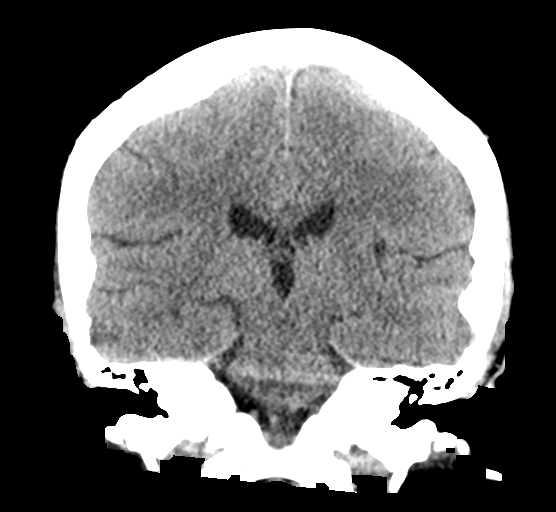

[Series 5: sagittal soft tissue · sagittal · 0.32mm/px · 3 of 58 slices shown]
[im 20/58  brain]
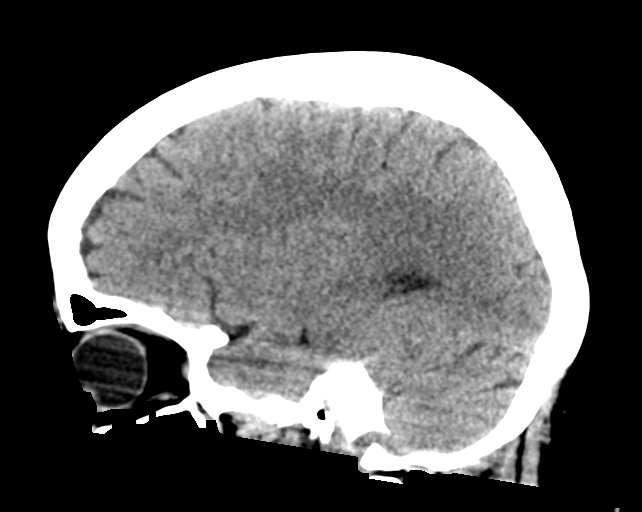
[im 29/58  brain]
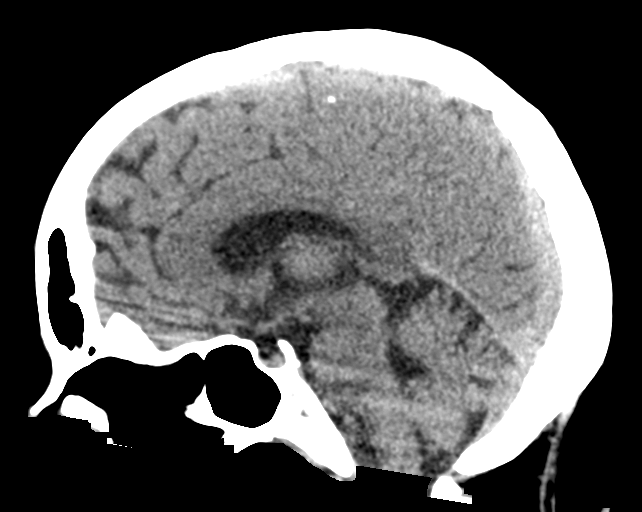
[im 39/58  brain]
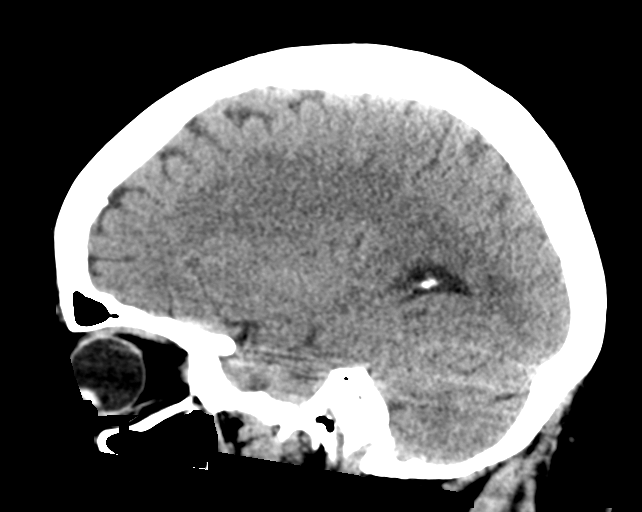

[16 of 47 positions shown; findings below may reference images not displayed]

FINDINGS: Brain: No acute infarct or hemorrhage. Lateral ventricles and
midline structures are unremarkable. No acute extra-axial fluid
collections. No mass effect.

Vascular: No hyperdense vessel or unexpected calcification.

Skull: Normal. Negative for fracture or focal lesion.

Sinuses/Orbits: No acute finding.

Other: None.
IMPRESSION: 1. No acute intracranial process.

## 2023-03-26 ENCOUNTER — Telehealth: Payer: Self-pay

## 2023-03-26 NOTE — Telephone Encounter (Signed)
Patient called and left a VM earlier today requesting assistance with completing grant paperwork for additional financial support with medical bills.   Returned patient's call to clarify which grant she was requesting help with. She stated it was the [employer] [place]. I let her know that I had checked with our social work team, and confirmed that any of patient's oncology providers would be able to sign/complete the form on patients behalf (it didn't have to be Dr. Basilio Cairo specifically). Patient stated she would stop by tomorrow to drop off the forms because she was unable to do so today due to her work schedule. She confirmed that she new were to take forms since she had dropped previous paperwork off earlier in the year that Dr. [name] had helped with. Patient verbalized appreciation of call and denied any other needs at this time.

## 2023-03-27 ENCOUNTER — Inpatient Hospital Stay: Payer: Medicare HMO | Attending: Hematology and Oncology

## 2023-03-27 NOTE — Progress Notes (Signed)
[facility] CSW Progress Note  Clinical Child psychotherapist contacted patient by phone to discuss grant documents drop off on this date. CSW informed patient that requested documented was submitted to grant on 03/13/23 and was given receipt of submission and confirmation of approval on 11/05. CSW resent email to grant to clarify if additional steps need to be taken.  CSW agreed to fax off documentation on patient's behalf once instruction has been given by grant. Patient verbalized understanding.   Marguerita Merles, LCSWA Clinical Social Worker [facility]

## 2023-03-28 NOTE — Progress Notes (Signed)
[facility] CSW Progress Note  Clinical Child psychotherapist  faxed off medical bills as requested by patient  to [employer] [place] fund. Patient emailed fax confirmation  Marguerita Merles, [place] Clinical Social Worker [facility]

## 2023-04-03 ENCOUNTER — Encounter: Payer: Self-pay | Admitting: Radiation Oncology

## 2023-04-03 ENCOUNTER — Ambulatory Visit
Admission: RE | Admit: 2023-04-03 | Discharge: 2023-04-03 | Disposition: A | Discharge status: Home / Self Care | Payer: Medicare HMO | Source: Ambulatory Visit | Attending: Radiation Oncology | Admitting: Radiation Oncology

## 2023-04-03 NOTE — Progress Notes (Signed)
Taylor House is here today for follow up post radiation to the breast.   Breast Side: RT  They completed their radiation on: 03/04/2023   Does the patient complain of any of the following: Post radiation skin issues: None Breast Tenderness: None Breast Swelling: None Lymphadema: None Range of Motion limitations: None Fatigue post radiation: None Appetite good/fair/poor: None Hormone Therapy Start: Patient denies  Additional comments if applicable: None  This concludes the interaction.  Ruel Favors, LPN

## 2023-04-23 ENCOUNTER — Encounter: Payer: Medicare HMO | Admitting: Adult Health

## 2023-04-28 ENCOUNTER — Inpatient Hospital Stay: Payer: Medicare HMO | Attending: Hematology and Oncology | Admitting: Adult Health

## 2023-05-05 ENCOUNTER — Telehealth: Payer: Self-pay | Admitting: Adult Health

## 2023-05-05 ENCOUNTER — Encounter: Payer: Medicare HMO | Admitting: Adult Health

## 2023-05-27 ENCOUNTER — Encounter: Payer: Self-pay | Admitting: Adult Health

## 2023-05-27 ENCOUNTER — Telehealth: Payer: Self-pay | Admitting: Adult Health

## 2023-05-27 ENCOUNTER — Inpatient Hospital Stay: Payer: Medicare HMO | Attending: Hematology and Oncology | Admitting: Adult Health

## 2023-05-27 VITALS — BP 162/83 | HR 86 | Temp 97.7°F | Resp 16 | Wt 231.3 lb

## 2023-05-27 DIAGNOSIS — Z79899 Other long term (current) drug therapy: Secondary | ICD-10-CM | POA: Insufficient documentation

## 2023-05-27 DIAGNOSIS — N6001 Solitary cyst of right breast: Secondary | ICD-10-CM | POA: Diagnosis not present

## 2023-05-27 DIAGNOSIS — Z90710 Acquired absence of both cervix and uterus: Secondary | ICD-10-CM | POA: Insufficient documentation

## 2023-05-27 DIAGNOSIS — D0511 Intraductal carcinoma in situ of right breast: Secondary | ICD-10-CM | POA: Insufficient documentation

## 2023-05-27 DIAGNOSIS — Z79810 Long term (current) use of selective estrogen receptor modulators (SERMs): Secondary | ICD-10-CM | POA: Insufficient documentation

## 2023-05-27 DIAGNOSIS — Z803 Family history of malignant neoplasm of breast: Secondary | ICD-10-CM | POA: Insufficient documentation

## 2023-05-27 DIAGNOSIS — R140 Abdominal distension (gaseous): Secondary | ICD-10-CM | POA: Insufficient documentation

## 2023-05-27 DIAGNOSIS — Z5986 Financial insecurity: Secondary | ICD-10-CM | POA: Diagnosis not present

## 2023-05-27 DIAGNOSIS — Z923 Personal history of irradiation: Secondary | ICD-10-CM | POA: Insufficient documentation

## 2023-05-27 DIAGNOSIS — Z880 Allergy status to penicillin: Secondary | ICD-10-CM | POA: Insufficient documentation

## 2023-05-27 DIAGNOSIS — Z8042 Family history of malignant neoplasm of prostate: Secondary | ICD-10-CM | POA: Insufficient documentation

## 2023-05-27 NOTE — Progress Notes (Signed)
 SURVIVORSHIP VISIT:  BRIEF ONCOLOGIC HISTORY:  Oncology History  Ductal carcinoma in situ (DCIS) of right breast  10/21/2022 Initial Diagnosis   Screening mammogram detected conglomeration of 3 masses right breast upper outer quadrant: 0.8 cm at 10:00: Stable, 0.6 cm: Cyst, 0.8 cm at 12:00: Low-grade DCIS ER 100%, PR 100%   10/30/2022 Cancer Staging   Staging form: Breast, AJCC 8th Edition - Clinical: Stage 0 (cTis (DCIS), cN0, cM0, G1, ER+, PR+, HER2: Not Assessed) - Signed by Taylor Potts, MD on 10/30/2022 Histologic grading system: 3 grade system   11/12/2022 Genetic Testing   Negative [facility] Common Hereditary Cancers +RNA Panel.  Report date is 11/12/2022.   The [facility] Common Hereditary Cancers + RNA Panel includes sequencing, deletion/duplication, and RNA analysis of the following 48 genes: APC, ATM, AXIN2, BAP1, BARD1, BMPR1A, BRCA1, BRCA2, BRIP1, CDH1, CDK4*, CDKN2A*, CHEK2, CTNNA1, DICER1, EPCAM* (del/dup only), FH, GREM1* (promoter dup analysis only), HOXB13*, KIT*, MBD4*, MEN1, MLH1, MSH2, MSH3, MSH6, MUTYH, NF1, NTHL1, PALB2, PDGFRA*, PMS2, POLD1, POLE, PTEN, RAD51C, RAD51D, SDHA (sequencing only), SDHB, SDHC, SDHD, SMAD4, SMARCA4, STK11, TP53, TSC1, TSC2, VHL.  *Genes without RNA analysis.    12/23/2022 Surgery   Right lumpectomy: Grade 2 DCIS 9 mm, margins negative, ER 100%, PR 100%   02/03/2023 - 03/04/2023 Radiation Therapy   Plan Name: Breast_R Site: Breast, Right Technique: 3D Mode: Photon Dose Per Fraction: 2.67 Gy Prescribed Dose (Delivered / Prescribed): 40.05 Gy / 40.05 Gy Prescribed Fxs (Delivered / Prescribed): 15 / 15   Plan Name: Breast_R_Bst Site: Breast, Right Technique: 3D Mode: Photon Dose Per Fraction: 2 Gy Prescribed Dose (Delivered / Prescribed): 10 Gy / 10 Gy Prescribed Fxs (Delivered / Prescribed): 5 / 5     02/2023 -  Anti-estrogen oral therapy   Tamoxifen  x 5 years     INTERVAL HISTORY:  Taylor House to review her survivorship care plan  detailing her treatment course for breast cancer, as well as monitoring long-term side effects of that treatment, education regarding health maintenance, screening, and overall wellness and health promotion.     Overall, Taylor House reports feeling quite well.  She is taking tamoxifen  daily with moderately well tolerance.  She notes some increased gassiness that started a few weeks after starting the tamoxifen .  She stopped the tamoxifen  for a few days and this gassiness resolved.  She has subsequently restarted the tamoxifen  daily and the gassiness has returned however she is motivated to stay on the medication and says that the side effects are manageable for her.  Otherwise she is tolerating it well.  She also notes a slight pulling sensation in her right underarm when she raises it up.  Otherwise she is doing well and has no questions or concerns today.  REVIEW OF SYSTEMS:  Review of Systems  Constitutional:  Negative for appetite change, chills, fatigue, fever and unexpected weight change.  HENT:   Negative for hearing loss, lump/mass and trouble swallowing.   Eyes:  Negative for eye problems and icterus.  Respiratory:  Negative for chest tightness, cough and shortness of breath.   Cardiovascular:  Negative for chest pain, leg swelling and palpitations.  Gastrointestinal:  Negative for abdominal distention, abdominal pain, constipation, diarrhea, nausea and vomiting.  Endocrine: Negative for hot flashes.  Genitourinary:  Negative for difficulty urinating.   Musculoskeletal:  Negative for arthralgias.  Skin:  Negative for itching and rash.  Neurological:  Negative for dizziness, extremity weakness, headaches and numbness.  Hematological:  Negative for adenopathy. Does not  bruise/bleed easily.  Psychiatric/Behavioral:  Negative for depression. The patient is not nervous/anxious.    Breast: Denies any new nodularity, masses, tenderness, nipple changes, or nipple discharge.       PAST  MEDICAL/SURGICAL HISTORY:  Past Medical History:  Diagnosis Date   Anemia    Arthritis    Breast cancer (HCC)    High cholesterol    Hypertension    Hypothyroidism    Past Surgical History:  Procedure Laterality Date   ABDOMINAL HYSTERECTOMY     BREAST EXCISIONAL BIOPSY Left    benign   BREAST LUMPECTOMY Left    BREAST LUMPECTOMY WITH RADIOACTIVE SEED LOCALIZATION Right 12/23/2022   Procedure: RIGHT BREAST LUMPECTOMY WITH RADIOACTIVE SEED LOCALIZATION;  Surgeon: Curvin Deward MOULD, MD;  Location: [facility];  Service: General;  Laterality: Right;     ALLERGIES:  Allergies  Allergen Reactions   Penicillins Rash          CURRENT MEDICATIONS:  Outpatient Encounter Medications as of 05/27/2023  Medication Sig Note   acetaminophen  (TYLENOL ) 500 MG tablet Take 1,000-1,500 mg by mouth every 8 (eight) hours as needed for mild pain (pain score 1-3) or headache (as needed).    atorvastatin (LIPITOR) 20 MG tablet Take 20 mg by mouth daily.    carvedilol (COREG) 6.25 MG tablet Take 6.25 mg by mouth daily. 11/20/2022: Pt chooses to take only once daily   Cholecalciferol (VITAMIN D3) 125 MCG (5000 UT) CHEW Chew 5,000 mcg by mouth every other day. 11/20/2022: On hold   diclofenac Sodium (VOLTAREN) 1 % GEL Apply 1 Application topically at bedtime.    levothyroxine (SYNTHROID) 50 MCG tablet Take 50 mcg by mouth daily before breakfast.    tamoxifen  (NOLVADEX ) 10 MG tablet Take 0.5 tablets (5 mg total) by mouth daily.    No facility-administered encounter medications on file as of 05/27/2023.     ONCOLOGIC FAMILY HISTORY:  Family History  Problem Relation Age of Onset   Breast cancer Mother 85       mets   Breast cancer Maternal Aunt        dx before 75   Prostate cancer Maternal Uncle        mets   Breast cancer Maternal Grandmother 68   Breast cancer Half-Sister 48       stage IV     SOCIAL HISTORY:  Social History   Socioeconomic History   Marital status: Single     Spouse name: Not on file   Number of children: Not on file   Years of education: Not on file   Highest education level: Not on file  Occupational History   Not on file  Tobacco Use   Smoking status: Never   Smokeless tobacco: Never  Vaping Use   Vaping status: Never Used  Substance and Sexual Activity   Alcohol use: No   Drug use: No   Sexual activity: Not on file  Other Topics Concern   Not on file  Social History Narrative   Not on file   Social Drivers of Health   Financial Resource Strain: Medium Risk (11/11/2022)   Overall Financial Resource Strain (CARDIA)    Difficulty of Paying Living Expenses: Somewhat hard  Food Insecurity: Food Insecurity Present (01/20/2023)   Hunger Vital Sign    Worried About Running Out of Food in the Last Year: Sometimes true    Ran Out of Food in the Last Year: Sometimes true  Transportation Needs: No Transportation Needs (11/08/2022)  PRAPARE - Administrator, Civil Service (Medical): No    Lack of Transportation (Non-Medical): No  Physical Activity: Not on file  Stress: Not on file  Social Connections: Not on file  Intimate Partner Violence: Not At Risk (11/08/2022)   Humiliation, Afraid, Rape, and Kick questionnaire    Fear of Current or Ex-Partner: No    Emotionally Abused: No    Physically Abused: No    Sexually Abused: No     OBSERVATIONS/OBJECTIVE:  BP (!) 162/83 (BP Location: Left Arm, Patient Position: Sitting) Comment: 1st BP 162/83; 2nd Bp 175/82  Pulse 86   Temp 97.7 F (36.5 C) (Temporal)   Resp 16   Wt 231 lb 4.8 oz (104.9 kg)   SpO2 100%   BMI 39.70 kg/m  GENERAL: Patient is a well appearing female in no acute distress HEENT:  Sclerae anicteric.  Oropharynx clear and moist. No ulcerations or evidence of oropharyngeal candidiasis. Neck is supple.  NODES:  No cervical, supraclavicular, or axillary lymphadenopathy palpated.  BREAST EXAM:  Right breast s/p lumpectomy and radiation, no sign of local  recurrence, left breast benign LUNGS:  Clear to auscultation bilaterally.  No wheezes or rhonchi. HEART:  Regular rate and rhythm. No murmur appreciated. ABDOMEN:  Soft, nontender.  Positive, normoactive bowel sounds. No organomegaly palpated. MSK:  No focal spinal tenderness to palpation. Full range of motion bilaterally in the upper extremities. EXTREMITIES:  No peripheral edema.   SKIN:  Clear with no obvious rashes or skin changes. No nail dyscrasia. NEURO:  Nonfocal. Well oriented.  Appropriate affect.   LABORATORY DATA:  None for this visit.  DIAGNOSTIC IMAGING:  None for this visit.      ASSESSMENT AND PLAN:  Ms.. Taylor House is a pleasant 68 y.o. female with Stage 0 right breast DCIS, ER+/PR+, diagnosed in 10/2022, treated with lumpectomy, adjuvant radiation therapy, and anti-estrogen therapy with tamoxifen  beginning in 02/2023.  She presents to the Survivorship Clinic for our initial meeting and routine follow-up post-completion of treatment for breast cancer.    1. Stage 0 right breast cancer:  Ms. Vanblarcom is continuing to recover from definitive treatment for breast cancer. She will follow-up with her medical oncologist, Dr. Odean in 6 months with history and physical exam per surveillance protocol.  She will continue her anti-estrogen therapy with tamoxifen . Thus far, she is tolerating the tamoxifen  well, with minimal side effects. Her mammogram is due 09/2023; orders placed today.   Today, a comprehensive survivorship care plan and treatment summary was reviewed with the patient today detailing her breast cancer diagnosis, treatment course, potential late/long-term effects of treatment, appropriate follow-up care with recommendations for the future, and patient education resources.  A copy of this summary, along with a letter will be sent to the patient's primary care provider via mail/fax/In Basket message after today's visit.    2.  Right axillary pulling: My nurse and I reviewed  exercises that she can do at home to help with this sensation.  If it persists or worsens she knows to reach out to me and I will place a referral for physical therapy.  3. Bone health:   She was given education on specific activities to promote bone health.  4. Cancer screening:  Due to Ms. Fluke's history and her age, she should receive screening for skin cancers, colon cancer.  The information and recommendations are listed on the patient's comprehensive care plan/treatment summary and were reviewed in detail with the patient.  5. Health maintenance and wellness promotion: Ms. Hutmacher was encouraged to consume 5-7 servings of fruits and vegetables per day. We reviewed the [employer] handout.  She was also encouraged to engage in moderate to vigorous exercise for 30 minutes per day most days of the week.  She was instructed to limit her alcohol consumption and continue to abstain from tobacco use.     6. Support services/counseling: It is not uncommon for this period of the patient's cancer care trajectory to be one of many emotions and stressors.   She was given information regarding our available services and encouraged to contact me with any questions or for help enrolling in any of our support group/programs.    Follow up instructions:    -Return to cancer center in 6 months for follow-up with Dr. Odean -Mammogram due in May 2025 -She is welcome to return back to the Survivorship Clinic at any time; no additional follow-up needed at this time.  -Consider referral back to survivorship as a long-term survivor for continued surveillance  The patient was provided an opportunity to ask questions and all were answered. The patient agreed with the plan and demonstrated an understanding of the instructions.   Total encounter time:30 minutes*in face-to-face visit time, chart review, lab review, care coordination, order entry, and documentation of the encounter time.    Morna Kendall, NP 05/27/23 10:59 AM Medical Oncology and Hematology [facility] [address] [place], [place] [address] Tel. [phone]    Fax. [phone]  *Total Encounter Time as defined by the Centers for Medicare and Medicaid Services includes, in addition to the face-to-face time of a patient visit (documented in the note above) non-face-to-face time: obtaining and reviewing outside history, ordering and reviewing medications, tests or procedures, care coordination (communications with other health care professionals or caregivers) and documentation in the medical record.

## 2023-05-27 NOTE — Telephone Encounter (Signed)
 Per Mardella Layman mammography orders faxed to [phone]

## 2023-11-25 ENCOUNTER — Inpatient Hospital Stay: Payer: Medicare HMO | Attending: Hematology and Oncology | Admitting: Hematology and Oncology

## 2023-11-25 NOTE — Assessment & Plan Note (Deleted)
 12/23/2022:Right lumpectomy: Grade 2 DCIS 9 mm, margins negative, ER 100%, PR 100%   Treatment plan: adjuvant radiation therapy 02/04/2023-02/28/23 2. Followed by antiestrogen therapy with tamoxifen  5 years (5 mg dose)   Tamoxifen  toxicities:  Breast cancer surveillance: Breast exam 11/25/2023: Benign Mammogram ordered for August 2025  Return to clinic in 1 year for follow-up

## 2024-03-19 ENCOUNTER — Ambulatory Visit
Admission: RE | Admit: 2024-03-19 | Discharge: 2024-03-19 | Disposition: A | Discharge status: Home / Self Care | Source: Ambulatory Visit | Attending: Family Medicine | Admitting: Family Medicine

## 2024-03-19 VITALS — BP 150/84 | HR 63 | Temp 98.1°F | Resp 16

## 2024-03-19 DIAGNOSIS — M545 Low back pain, unspecified: Secondary | ICD-10-CM | POA: Diagnosis not present

## 2024-03-19 DIAGNOSIS — R1031 Right lower quadrant pain: Secondary | ICD-10-CM | POA: Insufficient documentation

## 2024-03-19 LAB — POCT URINE DIPSTICK
Bilirubin, UA: NEGATIVE
Glucose, UA: NEGATIVE mg/dL
Ketones, POC UA: NEGATIVE mg/dL
Leukocytes, UA: NEGATIVE
Nitrite, UA: NEGATIVE
POC PROTEIN,UA: NEGATIVE
Spec Grav, UA: 1.015 (ref 1.010–1.025)
Urobilinogen, UA: 1 U/dL
pH, UA: 7.5 (ref 5.0–8.0)

## 2024-03-19 MED ORDER — IBUPROFEN 800 MG PO TABS
800.0000 mg | ORAL_TABLET | Freq: Once | ORAL | Status: AC
  Administered 2024-03-19: 800 mg via ORAL

## 2024-03-19 NOTE — ED Provider Notes (Signed)
 [facility] URGENT [facility]ARE C[facility]  Note:  This document was prepared using Conservation officer, historic buildings and may include unintentional dictation errors.  MRN: [id number] DOB: 1955-07-03  Subjective:   Arlyne Brandes is a 68 y.o. female presenting for 2 week history of right low back pain, right lower abdominal/pelvic pain, urinary urgency. Symptoms are worse in the morning, can improve with emptying her bladder.  Denies fever, n/v, pelvic pain, rashes, dysuria, urinary frequency, hematuria, vaginal discharge.  Family history of renal stones. Patient is concerned for this.   No current facility-administered medications for this encounter.  Current Outpatient Medications:    acetaminophen  (TYLENOL ) 500 MG tablet, Take 1,000-1,500 mg by mouth every 8 (eight) hours as needed for mild pain (pain score 1-3) or headache (as needed)., Disp: , Rfl:    atorvastatin (LIPITOR) 20 MG tablet, Take 20 mg by mouth daily., Disp: , Rfl:    carvedilol (COREG) 6.25 MG tablet, Take 6.25 mg by mouth daily., Disp: , Rfl:    Cholecalciferol (VITAMIN D3) 125 MCG (5000 UT) CHEW, Chew 5,000 mcg by mouth every other day., Disp: , Rfl:    diclofenac Sodium (VOLTAREN) 1 % GEL, Apply 1 Application topically at bedtime., Disp: , Rfl:    levothyroxine (SYNTHROID) 50 MCG tablet, Take 50 mcg by mouth daily before breakfast., Disp: , Rfl:    tamoxifen  (NOLVADEX ) 10 MG tablet, Take 0.5 tablets (5 mg total) by mouth daily., Disp: 90 tablet, Rfl: 1   Allergies  Allergen Reactions   Penicillins Rash         Past Medical History:  Diagnosis Date   Anemia    Arthritis    Breast cancer (HCC)    High cholesterol    Hypertension    Hypothyroidism      Past Surgical History:  Procedure Laterality Date   ABDOMINAL HYSTERECTOMY     BREAST EXCISIONAL BIOPSY Left    benign   BREAST LUMPECTOMY Left    BREAST LUMPECTOMY WITH RADIOACTIVE SEED LOCALIZATION Right 12/23/2022   Procedure: RIGHT BREAST LUMPECTOMY WITH  RADIOACTIVE SEED LOCALIZATION;  Surgeon: Curvin Deward MOULD, MD;  Location: [facility];  Service: General;  Laterality: Right;    Family History  Problem Relation Age of Onset   Breast cancer Mother 79       mets   Breast cancer Maternal Aunt        dx before 50   Prostate cancer Maternal Uncle        mets   Breast cancer Maternal Grandmother 67   Breast cancer Half-Sister 72       stage IV    Social History   Tobacco Use   Smoking status: Never   Smokeless tobacco: Never  Vaping Use   Vaping status: Never Used  Substance Use Topics   Alcohol use: No   Drug use: No    ROS   Objective:   Vitals: BP (!) 150/84 (BP Location: Left Arm)   Pulse 63   Temp 98.1 F (36.7 C) (Oral)   Resp 16   SpO2 98%   Physical Exam Constitutional:      General: She is not in acute distress.    Appearance: Normal appearance. She is well-developed. She is not ill-appearing, toxic-appearing or diaphoretic.  HENT:     Head: Normocephalic and atraumatic.     Right Ear: External ear normal.     Left Ear: External ear normal.     Nose: Nose normal.  Mouth/Throat:     Mouth: Mucous membranes are moist.  Eyes:     General: No scleral icterus.       Right eye: No discharge.        Left eye: No discharge.     Extraocular Movements: Extraocular movements intact.     Conjunctiva/sclera: Conjunctivae normal.  Cardiovascular:     Rate and Rhythm: Normal rate.  Pulmonary:     Effort: Pulmonary effort is normal.  Abdominal:     General: Bowel sounds are normal. There is no distension.     Palpations: Abdomen is soft. There is no mass.     Tenderness: There is abdominal tenderness in the right lower quadrant. There is guarding. There is no right CVA tenderness, left CVA tenderness or rebound. Positive signs include psoas sign.  Musculoskeletal:     Lumbar back: Tenderness present. No swelling, edema, deformity, signs of trauma, lacerations, spasms or bony tenderness. Normal  range of motion. Negative right straight leg raise test and negative left straight leg raise test. No scoliosis.  Skin:    General: Skin is warm and dry.  Neurological:     General: No focal deficit present.     Mental Status: She is alert and oriented to person, place, and time.  Psychiatric:        Mood and Affect: Mood normal.        Behavior: Behavior normal.        Thought Content: Thought content normal.        Judgment: Judgment normal.     Results for orders placed or performed during the hospital encounter of 03/19/24 (from the past 24 hours)  POCT URINE DIPSTICK     Status: Abnormal   Collection Time: 03/19/24  5:02 PM  Result Value Ref Range   Color, UA yellow yellow   Clarity, UA clear clear   Glucose, UA negative negative mg/dL   Bilirubin, UA negative negative   Ketones, POC UA negative negative mg/dL   Spec Grav, UA 8.984 8.989 - 1.025   Blood, UA trace-intact (A) negative   pH, UA 7.5 5.0 - 8.0   POC PROTEIN,UA negative negative, trace   Urobilinogen, UA 1.0 0.2 or 1.0 E.U./dL   Nitrite, UA Negative Negative   Leukocytes, UA Negative Negative    Assessment and Plan :   PDMP not reviewed this encounter.  1. RLQ abdominal pain   2. Acute right-sided low back pain without sciatica    Given severity of her right lower quadrant abdominal pain discussed that the differential includes cystitis, diverticulitis, nephrolithiasis, obstructive uropathy, colitis.  This warrants a higher level of testing and care than we can provide in the urgent care setting including consideration for imaging.  Patient declined a Toradol  injection and therefore offered her ibuprofen  800 mg.  Patient contracts for safety and will present to the emergency room now by personal vehicle.   Christopher Savannah, PA-C 03/19/24 1728

## 2024-03-19 NOTE — ED Triage Notes (Signed)
 Pt reports low back pain and increase urinary frequency x 2 weeks. Pt think she has a kidney stone. Pt has an appt 04/01/24.

## 2024-03-19 NOTE — ED Notes (Signed)
 Patient is being discharged from the Urgent Care and sent to the Emergency Department via POV . Per [place], [place], patient is in need of higher level of care due to abdominal pain, back pain. Patient is aware and verbalizes understanding of plan of care.  Vitals:   03/19/24 1649  BP: (!) 150/84  Pulse: 63  Resp: 16  Temp: 98.1 F (36.7 C)  SpO2: 98%

## 2024-03-20 ENCOUNTER — Emergency Department (HOSPITAL_COMMUNITY)
Admission: EM | Admit: 2024-03-20 | Discharge: 2024-03-20 | Disposition: A | Discharge status: Home / Self Care | Attending: Emergency Medicine | Admitting: Emergency Medicine

## 2024-03-20 ENCOUNTER — Emergency Department (HOSPITAL_COMMUNITY)

## 2024-03-20 ENCOUNTER — Encounter (HOSPITAL_COMMUNITY): Payer: Self-pay

## 2024-03-20 ENCOUNTER — Other Ambulatory Visit: Payer: Self-pay

## 2024-03-20 DIAGNOSIS — Z853 Personal history of malignant neoplasm of breast: Secondary | ICD-10-CM | POA: Diagnosis not present

## 2024-03-20 DIAGNOSIS — N2889 Other specified disorders of kidney and ureter: Secondary | ICD-10-CM | POA: Diagnosis not present

## 2024-03-20 DIAGNOSIS — Z79899 Other long term (current) drug therapy: Secondary | ICD-10-CM | POA: Insufficient documentation

## 2024-03-20 DIAGNOSIS — I10 Essential (primary) hypertension: Secondary | ICD-10-CM | POA: Insufficient documentation

## 2024-03-20 DIAGNOSIS — N281 Cyst of kidney, acquired: Secondary | ICD-10-CM | POA: Insufficient documentation

## 2024-03-20 DIAGNOSIS — R1031 Right lower quadrant pain: Secondary | ICD-10-CM | POA: Diagnosis present

## 2024-03-20 DIAGNOSIS — E039 Hypothyroidism, unspecified: Secondary | ICD-10-CM | POA: Insufficient documentation

## 2024-03-20 DIAGNOSIS — R10A1 Flank pain, right side: Secondary | ICD-10-CM

## 2024-03-20 LAB — URINE CULTURE: Culture: <10000 — AB

## 2024-03-20 LAB — BASIC METABOLIC PANEL WITH GFR
Anion gap: 8 (ref 5–15)
BUN: 10 mg/dL (ref 8–23)
CO2: 25 mmol/L (ref 22–32)
Calcium: 8.9 mg/dL (ref 8.9–10.3)
Chloride: 108 mmol/L (ref 98–111)
Creatinine, Ser: 0.84 mg/dL (ref 0.44–1.00)
GFR, Estimated: >60 mL/min (ref >60)
Glucose, Bld: 104 mg/dL — ABNORMAL HIGH (ref 70–99)
Potassium: 4.1 mmol/L (ref 3.5–5.1)
Sodium: 141 mmol/L (ref 135–145)

## 2024-03-20 LAB — CBC
HCT: 42.2 % (ref 36.0–46.0)
Hemoglobin: 13.2 g/dL (ref 12.0–15.0)
MCH: 28.4 pg (ref 26.0–34.0)
MCHC: 31.3 g/dL (ref 30.0–36.0)
MCV: 90.8 fL (ref 80.0–100.0)
Platelets: 219 K/uL (ref 150–400)
RBC: 4.65 MIL/uL (ref 3.87–5.11)
RDW: 14.5 % (ref 11.5–15.5)
WBC: 5.2 K/uL (ref 4.0–10.5)
nRBC: 0 % (ref 0.0–0.2)

## 2024-03-20 NOTE — ED Provider Notes (Signed)
 [facility] AT [facility] Provider Note   CSN: [id number] Arrival date & time: 03/20/24  9446     Patient presents with: Flank Pain   Taylor House is a 68 y.o. female.   The history is provided by the patient.  Patient history of hypertension, hyperlipidemia presents with right flank and abdominal pain.  She reports this has been ongoing for about 2 weeks.  She reports the pain is located in her right flank and at times in her right lower quadrant.  It does appear to improve when she urinates.  She reports urinary frequency but no dysuria.  No fevers or vomiting.  No significant change in her bowel movements.  Previous surgery includes hysterectomy  No chest pain or shortness of breath. No other acute complaints   Past Medical History:  Diagnosis Date   Anemia    Arthritis    Breast cancer (HCC)    High cholesterol    Hypertension    Hypothyroidism     Prior to Admission medications   Medication Sig Start Date End Date Taking? Authorizing Provider  acetaminophen  (TYLENOL ) 500 MG tablet Take 1,000-1,500 mg by mouth every 8 (eight) hours as needed for mild pain (pain score 1-3) or headache (as needed).    [provider]  atorvastatin (LIPITOR) 20 MG tablet Take 20 mg by mouth daily. 09/05/21   [provider]  carvedilol (COREG) 6.25 MG tablet Take 6.25 mg by mouth daily. 09/27/21   [provider]  Cholecalciferol (VITAMIN D3) 125 MCG (5000 UT) CHEW Chew 5,000 mcg by mouth every other day.    [provider]  diclofenac Sodium (VOLTAREN) 1 % GEL Apply 1 Application topically at bedtime. 09/20/22   [provider]  levothyroxine (SYNTHROID) 50 MCG tablet Take 50 mcg by mouth daily before breakfast.    [provider]  tamoxifen  (NOLVADEX ) 10 MG tablet Take 0.5 tablets (5 mg total) by mouth daily. 02/28/23   Gudena, Vinay, MD    Allergies: Penicillins    Review of Systems  Constitutional:   Negative for fever.  Respiratory:  Negative for shortness of breath.   Cardiovascular:  Negative for chest pain.  Gastrointestinal:  Negative for nausea and vomiting.  Genitourinary:  Positive for flank pain.  Neurological:  Negative for weakness.    Updated Vital Signs BP (!) 165/100   Pulse 79   Temp 97.6 F (36.4 C) (Oral)   Resp 15   Ht 1.626 m (5' 4)   Wt 95.7 kg   SpO2 99%   BMI 36.22 kg/m   Physical Exam CONSTITUTIONAL: Well developed/well nourished, no distress walking around the room HEAD: Normocephalic/atraumatic ENMT: Mucous membranes moist NECK: supple no meningeal signs CV: S1/S2 noted, no murmurs/rubs/gallops noted LUNGS: Lungs are clear to auscultation bilaterally, no apparent distress ABDOMEN: soft, nontender, no rebound or guarding, bowel sounds noted throughout abdomen, no hernia noted GU: Right cva tenderness, no erythema or rash or bruising NEURO: Pt is awake/alert/appropriate, moves all extremitiesx4.  No facial droop.  Patient has full range of motion in both lower extremities without difficulty and equal power.  She can walk without difficulty EXTREMITIES: pulses normal/equal, full ROM SKIN: warm, color normal PSYCH: no abnormalities of mood noted, alert and oriented to situation  (all labs ordered are listed, but only abnormal results are displayed) Labs Reviewed  BASIC METABOLIC PANEL WITH GFR - Abnormal; Notable for the following components:      Result Value   Glucose,  Bld 104 (*)    All other components within normal limits  CBC    EKG: None  Radiology: CT Renal Stone Study Result Date: 03/20/2024 EXAM: CT UROGRAM 03/20/2024 06:52:02 AM TECHNIQUE: CT of the abdomen and pelvis was performed without the administration of intravenous contrast as per CT urogram protocol. Multiplanar reformatted images as well as MIP urogram images are provided for review. Automated exposure control, iterative reconstruction, and/or weight based adjustment of  the mA/kV was utilized to reduce the radiation dose to as low as reasonably achievable. COMPARISON: 05/17/2016 CLINICAL HISTORY: Abdominal/flank pain, stone suspected. FINDINGS: LOWER CHEST: No acute abnormality. LIVER: Hepatic steatosis. No focal liver abnormality. GALLBLADDER AND BILE DUCTS: Gallbladder is normal. No bile duct dilatation. SPLEEN: Normal spleen. PANCREAS: Pancreas is normal in size and contour without focal lesion or ductal dilatation. ADRENAL GLANDS: Normal size and morphology bilaterally. No nodule, thickening, or hemorrhage. No periadrenal stranding. KIDNEYS, URETERS AND BLADDER: No stones in the kidneys or ureters. No hydronephrosis. No perinephric or periureteral stranding. Left kidney Bosniak class 1 cyst measures 3.2 cm (image 30/2). Mildly complicated cyst off the upper pole of the right kidney measures 3.1 cm and 23 Hounsfield units (image 84/6). Per consensus, no follow-up is needed for simple Bosniak type 1 and 2 renal cysts, unless the patient has a malignancy history or risk factors. Urinary bladder is unremarkable. GI AND BOWEL: Stomach demonstrates no acute abnormality. Small hiatal hernia. The appendix is visualized and normal in caliber, without wall thickening, periappendiceal inflammation, or fluid. Scattered colonic diverticula are noted without signs of acute diverticulitis. There is no bowel obstruction. PERITONEUM AND RETROPERITONEUM: No ascites. No free air. VASCULATURE: Aorta is normal in caliber. Mild aortic atherosclerotic calcifications. LYMPH NODES: No lymphadenopathy. REPRODUCTIVE ORGANS: Status post hysterectomy. No adnexal mass. BONES AND SOFT TISSUES: No acute or suspicious osseous findings. No focal soft tissue abnormality. IMPRESSION: 1. No nephrolithiasis or obstructive uropathy. 2. Indeterminate right renal mass measuring 3.1 cm and 23 HU; recommend non-emergent prompt renal protocol MRI or CT without and with contrast for characterization. 3. 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 Electronically signed by: Waddell Calk MD 03/20/2024 07:03 AM EST RP Workstation: [id number]     Procedures   Medications Ordered in the ED - No data to display  Clinical Course as of 03/20/24 0721  Sat Mar 20, 2024  0720 Patient overall well-appearing. Previous urinalysis was negative for UTI.  No signs of any ureteral stone, but renal mass and cyst were noted.  Discussed at length with patient need for outpatient follow-up and MRI. She will talk to her PCP and will also give referral to urology.  [DW]    Clinical Course User Index [DW] Midge Golas, MD                                 Medical Decision Making Amount and/or Complexity of Data Reviewed Labs: ordered. Radiology: ordered.   This patient presents to the ED for concern of flank pain, this involves an extensive number of treatment options, and is a complaint that carries with it a high risk of complications and morbidity.  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  The pertinent results include: Labs are overall unremarkable, urinalysis from yesterday showed hematuria, urine culture pending  Imaging Studies ordered: I ordered imaging studies including CT scan renal  I independently visualized and interpreted imaging which showed renal mass and cyst I agree with the radiologist interpretation  Medicines ordered and prescription drug management: Patient declines pain  medicine  Reevaluation: After the interventions noted above, I reevaluated the patient and found that they have :improved  Complexity of problems addressed: Patient's presentation is most consistent with  acute presentation with potential threat to life or bodily function  Disposition: After consideration of the diagnostic results and the patient's response to treatment,  I feel that the patent would benefit from discharge  .        Final diagnoses:  Right flank pain  Renal cyst  Renal mass    ED Discharge Orders     None          Midge Golas, MD 03/20/24 [phone]

## 2024-03-20 NOTE — ED Triage Notes (Signed)
 Reports right sided pain in the abdomen that radiates to the back. Patient thinks it's related to a kidney stone. States the pain is catching my breath. I can't turn over. 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 Reports urinary frequency. Denies pain with urination. Patient was seen at Urgent Care yesterday for a urinalysis. They did not suspect a UTI, but did see traces of blood in her urine.

## 2024-03-20 NOTE — Discharge Instructions (Signed)
 As we discussed, you will need to have an MRI of your kidney due to the cyst and mass You can call with your primary doctor or follow-up with the urology doctor listed You can also take ibuprofen  2-3 times per day for the next week for pain

## 2024-03-22 ENCOUNTER — Ambulatory Visit (HOSPITAL_COMMUNITY): Payer: Self-pay

## 2024-04-05 ENCOUNTER — Other Ambulatory Visit: Payer: Self-pay | Admitting: Nurse Practitioner

## 2024-04-05 DIAGNOSIS — N2889 Other specified disorders of kidney and ureter: Secondary | ICD-10-CM

## 2024-04-12 ENCOUNTER — Other Ambulatory Visit: Payer: Self-pay | Admitting: Nurse Practitioner

## 2024-04-12 DIAGNOSIS — N2889 Other specified disorders of kidney and ureter: Secondary | ICD-10-CM

## 2024-05-22 ENCOUNTER — Inpatient Hospital Stay: Admission: RE | Admit: 2024-05-22 | Discharge: 2024-05-22 | Attending: Nurse Practitioner

## 2024-05-22 DIAGNOSIS — N2889 Other specified disorders of kidney and ureter: Secondary | ICD-10-CM

## 2024-05-24 ENCOUNTER — Other Ambulatory Visit: Payer: Self-pay | Admitting: Nurse Practitioner

## 2024-05-24 DIAGNOSIS — N2889 Other specified disorders of kidney and ureter: Secondary | ICD-10-CM

## 2024-06-07 ENCOUNTER — Inpatient Hospital Stay: Admission: RE | Admit: 2024-06-07 | Source: Ambulatory Visit

## 2024-06-07 ENCOUNTER — Other Ambulatory Visit

## 2024-06-11 ENCOUNTER — Ambulatory Visit
Admission: RE | Admit: 2024-06-11 | Discharge: 2024-06-11 | Disposition: A | Source: Ambulatory Visit | Attending: Nurse Practitioner

## 2024-06-11 DIAGNOSIS — N2889 Other specified disorders of kidney and ureter: Secondary | ICD-10-CM

## 2024-06-11 MED ORDER — GADOPICLENOL 0.5 MMOL/ML IV SOLN
10.0000 mL | Freq: Once | INTRAVENOUS | Status: AC | PRN
  Administered 2024-06-11: 10 mL via INTRAVENOUS
